# Patient Record
Sex: Female | Born: 1965
Health system: Southern US, Community
[De-identification: ages and names within clinical notes are randomized; demographics above are authoritative.]

## PROBLEM LIST (undated history)

## (undated) DIAGNOSIS — M1990 Unspecified osteoarthritis, unspecified site: Secondary | ICD-10-CM

## (undated) DIAGNOSIS — R0600 Dyspnea, unspecified: Secondary | ICD-10-CM

## (undated) DIAGNOSIS — I10 Essential (primary) hypertension: Secondary | ICD-10-CM

## (undated) DIAGNOSIS — F410 Panic disorder [episodic paroxysmal anxiety] without agoraphobia: Secondary | ICD-10-CM

## (undated) HISTORY — PX: APPENDECTOMY: SHX54

## (undated) HISTORY — DX: Unspecified osteoarthritis, unspecified site: M19.90

## (undated) HISTORY — DX: Dyspnea, unspecified: R06.00

## (undated) HISTORY — PX: TUBAL LIGATION: SHX77

## (undated) HISTORY — PX: JOINT REPLACEMENT: SHX530

## (undated) HISTORY — DX: Essential (primary) hypertension: I10

---

## 1998-05-12 ENCOUNTER — Inpatient Hospital Stay (HOSPITAL_COMMUNITY): Admission: AD | Admit: 1998-05-12 | Discharge: 1998-05-14 | Payer: Self-pay | Admitting: Obstetrics and Gynecology

## 2000-06-13 ENCOUNTER — Other Ambulatory Visit: Admission: RE | Admit: 2000-06-13 | Discharge: 2000-06-13 | Payer: Self-pay | Admitting: Obstetrics and Gynecology

## 2001-11-29 ENCOUNTER — Other Ambulatory Visit: Admission: RE | Admit: 2001-11-29 | Discharge: 2001-11-29 | Payer: Self-pay | Admitting: *Deleted

## 2003-01-30 ENCOUNTER — Other Ambulatory Visit: Admission: RE | Admit: 2003-01-30 | Discharge: 2003-01-30 | Payer: Self-pay | Admitting: Obstetrics and Gynecology

## 2004-03-08 ENCOUNTER — Other Ambulatory Visit: Admission: RE | Admit: 2004-03-08 | Discharge: 2004-03-08 | Payer: Self-pay | Admitting: Obstetrics and Gynecology

## 2005-05-30 ENCOUNTER — Other Ambulatory Visit: Admission: RE | Admit: 2005-05-30 | Discharge: 2005-05-30 | Payer: Self-pay | Admitting: Obstetrics and Gynecology

## 2005-12-08 ENCOUNTER — Inpatient Hospital Stay (HOSPITAL_COMMUNITY): Admission: AD | Admit: 2005-12-08 | Discharge: 2005-12-10 | Payer: Self-pay | Admitting: Obstetrics and Gynecology

## 2005-12-09 ENCOUNTER — Encounter (INDEPENDENT_AMBULATORY_CARE_PROVIDER_SITE_OTHER): Payer: Self-pay | Admitting: *Deleted

## 2008-01-16 ENCOUNTER — Ambulatory Visit (HOSPITAL_COMMUNITY): Admission: RE | Admit: 2008-01-16 | Discharge: 2008-01-16 | Payer: Self-pay | Admitting: Internal Medicine

## 2008-05-20 ENCOUNTER — Encounter (INDEPENDENT_AMBULATORY_CARE_PROVIDER_SITE_OTHER): Payer: Self-pay | Admitting: Obstetrics and Gynecology

## 2008-05-20 ENCOUNTER — Ambulatory Visit (HOSPITAL_COMMUNITY): Admission: RE | Admit: 2008-05-20 | Discharge: 2008-05-20 | Payer: Self-pay | Admitting: Obstetrics and Gynecology

## 2008-11-30 ENCOUNTER — Emergency Department (HOSPITAL_COMMUNITY): Admission: EM | Admit: 2008-11-30 | Discharge: 2008-12-01 | Payer: Self-pay | Admitting: Emergency Medicine

## 2009-01-28 ENCOUNTER — Ambulatory Visit (HOSPITAL_COMMUNITY): Admission: RE | Admit: 2009-01-28 | Discharge: 2009-01-28 | Payer: Self-pay | Admitting: Internal Medicine

## 2009-05-23 ENCOUNTER — Emergency Department (HOSPITAL_COMMUNITY): Admission: EM | Admit: 2009-05-23 | Discharge: 2009-05-23 | Payer: Self-pay | Admitting: Emergency Medicine

## 2010-02-12 ENCOUNTER — Ambulatory Visit (HOSPITAL_COMMUNITY): Admission: RE | Admit: 2010-02-12 | Discharge: 2010-02-12 | Payer: Self-pay | Admitting: Family Medicine

## 2010-03-19 ENCOUNTER — Ambulatory Visit: Payer: Self-pay | Admitting: Cardiology

## 2010-07-17 LAB — POCT CARDIAC MARKERS
Myoglobin, poc: 38.5 ng/mL (ref 12–200)
Troponin i, poc: <0.05 ng/mL (ref 0.00–0.09)

## 2010-07-17 LAB — BASIC METABOLIC PANEL
BUN: 8 mg/dL (ref 6–23)
Creatinine, Ser: 0.88 mg/dL (ref 0.4–1.2)
GFR calc Af Amer: >60 mL/min (ref >60)
GFR calc non Af Amer: >60 mL/min (ref >60)
Potassium: 3.4 mEq/L — ABNORMAL LOW (ref 3.5–5.1)

## 2010-07-17 LAB — DIFFERENTIAL
Lymphocytes Relative: 40 % (ref 12–46)
Lymphs Abs: 2 10*3/uL (ref 0.7–4.0)
Monocytes Relative: 7 % (ref 3–12)
Neutrophils Relative %: 51 % (ref 43–77)

## 2010-07-17 LAB — CBC
HCT: 34.7 % — ABNORMAL LOW (ref 36.0–46.0)
Platelets: 229 10*3/uL (ref 150–400)
RBC: 3.73 MIL/uL — ABNORMAL LOW (ref 3.87–5.11)
WBC: 4.9 10*3/uL (ref 4.0–10.5)

## 2010-07-27 LAB — CBC
Platelets: 235 10*3/uL (ref 150–400)
WBC: 3.9 10*3/uL — ABNORMAL LOW (ref 4.0–10.5)

## 2010-07-27 LAB — HCG, SERUM, QUALITATIVE: Preg, Serum: NEGATIVE

## 2010-08-24 NOTE — Op Note (Signed)
Evelyn Moyer, Evelyn Moyer                 ACCOUNT NO.:  [id number]   MEDICAL RECORD NO.:  [id number]          PATIENT TYPE:  AMB   LOCATION:  SDC                           FACILITY:  [facility]   PHYSICIAN:  Osborn Coho, M.D.   DATE OF BIRTH:  1965-06-28   DATE OF PROCEDURE:  05/20/2008  DATE OF DISCHARGE:                               OPERATIVE REPORT   PREOPERATIVE DIAGNOSES:  1. Menometrorrhagia.  2. Endometrial polyps.   POSTOPERATIVE DIAGNOSES:  1. Menometrorrhagia.  2. Endometrial polyps.   PROCEDURE:  1. Hysteroscopy.  2. D and C.  3. NovaSure ablation.   ATTENDING DOCTOR:  Osborn Coho, MD   ANESTHESIA:  General via LMA.   SPECIMENS TO PATHOLOGY:  Endometrial curettings.   FLUIDS:  1500 mL.   URINE OUTPUT:  Quantity sufficient approximately 300 mL of urine via  straight cath prior to procedure.   HYSTEROSCOPIC FLUID DEFICIT OF LR:  45 mL.   ESTIMATED BLOOD LOSS:  Minimal.   COMPLICATIONS:  None.   PROCEDURE:  The patient was taken to the operating room after risks,  benefits, and alternatives discussed with the patient.  The patient  verbalized understanding and consent signed and witnessed.  The patient  was placed under general anesthesia and prepped and draped in the normal  sterile fashion in the dorsal lithotomy position.  A bivalve speculum  was placed in the patient's vagina and the anterior lip of the cervix  grasped with single-tooth tenaculum.  The uterus sounded to 11 cm and  the cervical length measured 4.5 cm.  The cervix was sufficiently  dilated already for passage of the hysteroscope and hysteroscope was  introduced and multiple appearing polypoid lesions noted.  Curettage was  performed until a gritty texture was noted and specimen sent to  pathology.  The cervix was also sufficiently dilated already for passage  of the NovaSure instrument which was introduced into the uterine cavity  and the width measured 4.6 cm.  Ablation was performed after  cavity  assessment was passed which required the Vaseline gauze and an  additional tenaculum on the cervix.  The ablation was performed without  difficulty after cavity assessment passed and NovaSure instrument  removed.  The hysteroscope was then introduced and good ablation results  were noted.  The tenaculums were removed and there was some bleeding at  1 of the tenaculum sites which was made hemostatic with an interrupted  stitch of 3-0 chromic.  All instruments were removed.  Sponge, lap, and  needle count was correct.  The patient tolerated the procedure well and  is currently awaiting transfer to the recovery room in good condition.      Osborn Coho, M.D.  Electronically Signed     AR/MEDQ  D:  05/20/2008  T:  05/21/2008  Job:  [id number]

## 2010-08-27 NOTE — Op Note (Signed)
Evelyn Moyer, Evelyn Moyer                 ACCOUNT NO.:  [id number]   MEDICAL RECORD NO.:  [id number]          PATIENT TYPE:  INP   LOCATION:  9111                          FACILITY:  [facility]   PHYSICIAN:  Crist Fat. Rivard, M.D. DATE OF BIRTH:  1965-08-22   DATE OF PROCEDURE:  12/09/2005  DATE OF DISCHARGE:                                 OPERATIVE REPORT   PREOPERATIVE DIAGNOSIS:  Desire for sterilization.   POSTOPERATIVE DIAGNOSIS:  Desire for sterilization.   ANESTHESIA:  Epidural.   PROCEDURE:  Postpartum bilateral tubal ligation.   SURGEON:  Crist Fat. Rivard, M.D.   ESTIMATED BLOOD LOSS:  Minimal.   PROCEDURE:  After being informed of the planned procedure with possible  complications including irreversibility, failure rate of 1 in 1000, injury  to other organs, bleeding, infection, informed consent was obtained. The  patient is taken to OR3 and preplaced epidural catheter is used to ensure  epidural anesthesia.  The patient is then prepped and draped in a sterile  fashion.  After assessing adequate level of anesthesia, the umbilical area  is infiltrated with 7 mL of Marcaine 0.25 and we perform a semi-elliptical  incision which was bluntly brought down to the fascia.  The fascia is  identified, grasped with Kocher forceps, and incised.  The peritoneum is  identified, grasped with hemostat, and sectioned.  We then identified the  left tube which was grasped with two Babcock forceps, exteriorized until the  fimbriae is visualized, and we note at that time that there is significant  edema of the mesosalpinx, otherwise, the tube appears normal.  Mesosalpinx  was entered with cautery and we doubly ligate the proximal and the distal  stump with 0 chromic. A section of 1.5 cm of tube is then excised in the  isthmic ampullary area and each stump was cauterized.  Hemostasis was  completed. We proceed in the same fashion on the right side which also  showed some edema of the mesosalpinx.   Hemostasis was also adequate.   The fascia is closed with a running suture of 0 Vicryl and skin is then  closed with a subcuticular suture of 4-0 Monocryl and Steri-Strips.  Instrument and sponge count is complete x2.  Estimated blood loss is  minimal.  The procedure is well tolerated by the patient who is taken to  recovery room in a well and stable condition.      Crist Fat Rivard, M.D.  Electronically Signed     SAR/MEDQ  D:  12/09/2005  T:  12/09/2005  Job:  [id number]

## 2010-08-27 NOTE — H&P (Signed)
Evelyn Moyer, Evelyn Moyer                 ACCOUNT NO.:  [id number]   MEDICAL RECORD NO.:  [id number]          PATIENT TYPE:  MAT   LOCATION:  MATC                          [facility]ILITY:  [facility]   PHYSICIAN:  Hal Morales, M.D.DATE OF BIRTH:  07-03-1965   DATE OF ADMISSION:  12/08/2005  DATE OF DISCHARGE:                                HISTORY & PHYSICAL   This is a 45 year old G 3, P 2-0-0-2 at 86 and 3/7 weeks who presents with  early labor.  She denies leaking or bleeding.  Reports positive fetal  movement.  Pregnancy has been followed by the nurse-midwife service  remarkable for.  1. History of HSV.  2. AMA.  3. History of rapid labor.  4. History of oligo.  5. Fibroid.   ALLERGIES:  None.   OB HISTORY:  Remarkable for vaginal delivery in 1998 of a female infant at  [redacted] weeks gestation weighing 6 pounds, 2 ounces, remarkable for a 2 hour  labor.  She had a vaginal delivery in 2000 of a female infant at [redacted] weeks  gestation weighing 7 pounds.  Remarkable oligohydramnios and a 2 hour labor.   MEDICAL HISTORY:  Remarkable for a history of abnormal Pap in 1999 with  normal Pap since then, history of HSV diagnosed in 1994.   SURGICAL HISTORY:  Remarkable for appendectomy at age 43.   FAMILY HISTORY:  Remarkable for a daughter with heart murmur, mother with  hypertension, grandmother with aneurysm, son with asthma, grandmother with  diabetes and renal failure and stroke, mother with fibromyalgia, mother with  cervical cancer.   GENETIC HISTORY:  Remarkable for a daughter with heart murmur, father of the  baby with sickle cell trait, and father of the baby's aunt with sickle cell  disease, the father of the baby's father with MS.   SOCIAL HISTORY:  The patient is married to [employer] who is involved and  supportive.  She is of the [facility].  She is homemaker and student, and  her husband is an Pensions consultant.  She denies any alcohol, tobacco or drug use.   PRENATAL LABS:  Hemoglobin  12.2, platelets 279, blood type A positive,  antibody screen negative for sickle cell, negative RPR, nonreactive rubella  immune, hepatitis negative, HIV negative, cystic fibrosis declined.   HISTORY OF CURRENT PREGNANCY:  The patient enter care at 10-weeks gestation.  She had an ultrasound to confirm dates at that time, anterior fibroid 3-cm  was seen.  She declined amniocentesis, but did consent first trimester  screen which was normal.  She had an ultrasound at 20-weeks which was  normal.  Glucola was done at 28-weeks and was elevated at 138.  Three hour  GT was normal and she had group B strep positive at term.   PHYSICAL EXAMINATION:  VITAL SIGNS:  Stable, afebrile.  HEENT:  Within normal limits.  Thyroid normal, not enlarged.  CHEST:  Clear to auscultation.  HEART:  Regular rate and rhythm.  ABDOMEN:  Gravid at 37-cm, vertex Thayer Ohm shows reactive fetal heart rate  with contractions every 3-10 minutes.  Cervix was initially 2-cm and is now  4 cm, 90% effaced -1 with bulging membranes.  PELVIC EXAM:  Negative for HSV lesions.  EXTREMITIES:  Within normal limits.   ASSESSMENT:  1. Intrauterine pregnancy at 37 and 3/7 weeks.  2. Early labor.  3. Group B streptococcal positive.  4. History of rapid labor.   PLAN:  1. Admit to birthing suite, Dr. Pennie Rushing notified.  2. Routine CMN orders.  3. Penicillin prophylaxis.  4. AROM amniotomy after penicillin infused.      Marie L. Williams, C.N.M.      Hal Morales, M.D.  Electronically Signed    MLW/MEDQ  D:  12/08/2005  T:  12/08/2005  Job:  [id number]

## 2011-02-18 ENCOUNTER — Other Ambulatory Visit (HOSPITAL_COMMUNITY): Payer: Self-pay | Admitting: Obstetrics and Gynecology

## 2011-02-18 DIAGNOSIS — Z139 Encounter for screening, unspecified: Secondary | ICD-10-CM

## 2011-02-24 ENCOUNTER — Ambulatory Visit (HOSPITAL_COMMUNITY)
Admission: RE | Admit: 2011-02-24 | Discharge: 2011-02-24 | Disposition: A | Discharge status: Home / Self Care | Payer: BC Managed Care – PPO | Source: Ambulatory Visit | Attending: Obstetrics and Gynecology | Admitting: Obstetrics and Gynecology

## 2011-02-24 DIAGNOSIS — Z139 Encounter for screening, unspecified: Secondary | ICD-10-CM

## 2011-02-24 DIAGNOSIS — Z1231 Encounter for screening mammogram for malignant neoplasm of breast: Secondary | ICD-10-CM | POA: Insufficient documentation

## 2011-03-04 ENCOUNTER — Other Ambulatory Visit: Payer: Self-pay | Admitting: Obstetrics and Gynecology

## 2011-03-04 DIAGNOSIS — R928 Other abnormal and inconclusive findings on diagnostic imaging of breast: Secondary | ICD-10-CM

## 2011-03-30 ENCOUNTER — Ambulatory Visit (HOSPITAL_COMMUNITY)
Admission: RE | Admit: 2011-03-30 | Discharge: 2011-03-30 | Disposition: A | Discharge status: Home / Self Care | Payer: BC Managed Care – PPO | Source: Ambulatory Visit | Attending: Obstetrics and Gynecology | Admitting: Obstetrics and Gynecology

## 2011-03-30 ENCOUNTER — Other Ambulatory Visit (HOSPITAL_COMMUNITY): Payer: Self-pay | Admitting: Obstetrics and Gynecology

## 2011-03-30 DIAGNOSIS — R928 Other abnormal and inconclusive findings on diagnostic imaging of breast: Secondary | ICD-10-CM | POA: Insufficient documentation

## 2011-06-30 ENCOUNTER — Ambulatory Visit (INDEPENDENT_AMBULATORY_CARE_PROVIDER_SITE_OTHER): Payer: BC Managed Care – PPO | Admitting: Obstetrics and Gynecology

## 2011-06-30 DIAGNOSIS — Z01419 Encounter for gynecological examination (general) (routine) without abnormal findings: Secondary | ICD-10-CM

## 2011-06-30 DIAGNOSIS — Z13820 Encounter for screening for osteoporosis: Secondary | ICD-10-CM

## 2011-09-04 ENCOUNTER — Encounter: Payer: Self-pay | Admitting: *Deleted

## 2011-10-18 ENCOUNTER — Other Ambulatory Visit (HOSPITAL_COMMUNITY): Payer: Self-pay | Admitting: Family Medicine

## 2011-10-18 DIAGNOSIS — IMO0001 Reserved for inherently not codable concepts without codable children: Secondary | ICD-10-CM

## 2011-10-18 DIAGNOSIS — Z139 Encounter for screening, unspecified: Secondary | ICD-10-CM

## 2011-10-26 ENCOUNTER — Ambulatory Visit (HOSPITAL_COMMUNITY)
Admission: RE | Admit: 2011-10-26 | Discharge: 2011-10-26 | Disposition: A | Discharge status: Home / Self Care | Payer: BC Managed Care – PPO | Source: Ambulatory Visit | Attending: Family Medicine | Admitting: Family Medicine

## 2011-10-26 ENCOUNTER — Other Ambulatory Visit (HOSPITAL_COMMUNITY): Payer: Self-pay | Admitting: Family Medicine

## 2011-10-26 DIAGNOSIS — R928 Other abnormal and inconclusive findings on diagnostic imaging of breast: Secondary | ICD-10-CM | POA: Insufficient documentation

## 2011-10-26 DIAGNOSIS — IMO0001 Reserved for inherently not codable concepts without codable children: Secondary | ICD-10-CM

## 2011-10-26 DIAGNOSIS — Z09 Encounter for follow-up examination after completed treatment for conditions other than malignant neoplasm: Secondary | ICD-10-CM | POA: Insufficient documentation

## 2012-05-16 ENCOUNTER — Other Ambulatory Visit (HOSPITAL_COMMUNITY): Payer: Self-pay | Admitting: Family Medicine

## 2012-05-16 DIAGNOSIS — Z09 Encounter for follow-up examination after completed treatment for conditions other than malignant neoplasm: Secondary | ICD-10-CM

## 2012-05-23 ENCOUNTER — Ambulatory Visit (HOSPITAL_COMMUNITY)
Admission: RE | Admit: 2012-05-23 | Discharge: 2012-05-23 | Disposition: A | Discharge status: Home / Self Care | Payer: BC Managed Care – PPO | Source: Ambulatory Visit | Attending: Family Medicine | Admitting: Family Medicine

## 2012-05-23 DIAGNOSIS — N6489 Other specified disorders of breast: Secondary | ICD-10-CM | POA: Insufficient documentation

## 2012-05-23 DIAGNOSIS — Z09 Encounter for follow-up examination after completed treatment for conditions other than malignant neoplasm: Secondary | ICD-10-CM | POA: Insufficient documentation

## 2013-08-13 ENCOUNTER — Other Ambulatory Visit: Payer: Self-pay | Admitting: Obstetrics and Gynecology

## 2013-08-13 DIAGNOSIS — Z139 Encounter for screening, unspecified: Secondary | ICD-10-CM

## 2013-08-22 ENCOUNTER — Ambulatory Visit (HOSPITAL_COMMUNITY)
Admission: RE | Admit: 2013-08-22 | Discharge: 2013-08-22 | Disposition: A | Discharge status: Home / Self Care | Payer: BC Managed Care – PPO | Source: Ambulatory Visit | Attending: Obstetrics and Gynecology | Admitting: Obstetrics and Gynecology

## 2013-08-22 DIAGNOSIS — Z139 Encounter for screening, unspecified: Secondary | ICD-10-CM

## 2013-08-22 DIAGNOSIS — Z1231 Encounter for screening mammogram for malignant neoplasm of breast: Secondary | ICD-10-CM | POA: Insufficient documentation

## 2015-02-15 ENCOUNTER — Encounter (HOSPITAL_COMMUNITY): Payer: Self-pay | Admitting: *Deleted

## 2015-02-15 ENCOUNTER — Emergency Department (HOSPITAL_COMMUNITY)
Admission: EM | Admit: 2015-02-15 | Discharge: 2015-02-15 | Disposition: A | Discharge status: Home / Self Care | Payer: BLUE CROSS/BLUE SHIELD | Attending: Emergency Medicine | Admitting: Emergency Medicine

## 2015-02-15 DIAGNOSIS — Z79899 Other long term (current) drug therapy: Secondary | ICD-10-CM | POA: Insufficient documentation

## 2015-02-15 DIAGNOSIS — R6883 Chills (without fever): Secondary | ICD-10-CM | POA: Diagnosis present

## 2015-02-15 DIAGNOSIS — M549 Dorsalgia, unspecified: Secondary | ICD-10-CM | POA: Diagnosis not present

## 2015-02-15 DIAGNOSIS — J029 Acute pharyngitis, unspecified: Secondary | ICD-10-CM | POA: Diagnosis not present

## 2015-02-15 LAB — URINALYSIS, ROUTINE W REFLEX MICROSCOPIC
BILIRUBIN URINE: NEGATIVE
Glucose, UA: NEGATIVE mg/dL
Hgb urine dipstick: NEGATIVE
KETONES UR: NEGATIVE mg/dL
LEUKOCYTES UA: NEGATIVE
NITRITE: NEGATIVE
PROTEIN: NEGATIVE mg/dL
Specific Gravity, Urine: 1.023 (ref 1.005–1.030)
UROBILINOGEN UA: 0.2 mg/dL (ref 0.0–1.0)
pH: 6.5 (ref 5.0–8.0)

## 2015-02-15 LAB — RAPID STREP SCREEN (MED CTR MEBANE ONLY): STREPTOCOCCUS, GROUP A SCREEN (DIRECT): NEGATIVE

## 2015-02-15 MED ORDER — IBUPROFEN 800 MG PO TABS: 800.0000 mg | ORAL_TABLET | Freq: Three times a day (TID) | ORAL | Status: DC | PRN

## 2015-02-15 MED ORDER — KETOROLAC TROMETHAMINE 60 MG/2ML IM SOLN
60.0000 mg | Freq: Once | INTRAMUSCULAR | Status: AC
  Administered 2015-02-15: 60 mg via INTRAMUSCULAR
  Filled 2015-02-15: qty 2

## 2015-02-15 NOTE — ED Provider Notes (Signed)
CSN: [id number]     Arrival date & time 02/15/15  [id number] History   First MD Initiated Contact with Patient 02/15/15 0900     Chief Complaint  Patient presents with  . Generalized Body Aches     (Consider location/radiation/quality/duration/timing/severity/associated sxs/prior Treatment) The history is provided by the patient.     Pt p/w 4 days of sore throat, sharp left mid-back pain, chills, myalgias.  Has chronic sinus congestion as well.  Has taken mucinex and motrin without improvement.  Last dose motrin last night- has been taking only 200mg  once daily. Denies cough, SOB, abdominal pain, urinary, vaginal, or bowel symptoms.  Does not have periods - had ablation in 2007.    Past Medical History  Diagnosis Date  . Dyspnea    Past Surgical History  Procedure Laterality Date  . Appendectomy    . Tubal ligation     Family History  Problem Relation Age of Onset  . Hypertension Mother   . Diabetes Mother    Social History  Substance Use Topics  . Smoking status: Never Smoker   . Smokeless tobacco: None  . Alcohol Use: 0.6 oz/week    1 Glasses of wine per week   OB History    No data available     Review of Systems  All other systems reviewed and are negative.     Allergies  Review of patient's allergies indicates no known allergies.  Home Medications   Prior to Admission medications   Medication Sig Start Date End Date Taking? Authorizing Provider  cholecalciferol (VITAMIN D) 1000 UNITS tablet Take 1,000 Units by mouth daily.   Yes Historical Provider, MD  valACYclovir (VALTREX) 500 MG tablet Take 500 mg by mouth daily as needed.   Yes Historical Provider, MD   BP 123/54 mmHg  Pulse 89  Temp(Src) 99.9 F (37.7 C) (Oral)  Resp 18  Ht 5\' 1"  (1.549 m)  Wt 110 lb (49.896 kg)  BMI 20.80 kg/m2  SpO2 100% Physical Exam  Constitutional: She appears well-developed and well-nourished. No distress.  HENT:  Head: Normocephalic and atraumatic.  Mouth/Throat: Mucous  membranes are not dry. Posterior oropharyngeal erythema present. No oropharyngeal exudate, posterior oropharyngeal edema or tonsillar abscesses.  Neck: Normal range of motion. Neck supple.  Cardiovascular: Normal rate and regular rhythm.   Pulmonary/Chest: Effort normal and breath sounds normal. No respiratory distress. She has no wheezes. She has no rales.  Abdominal: Soft. She exhibits no distension. There is no tenderness. There is no rebound and no guarding.  Lymphadenopathy:    She has no cervical adenopathy.  Neurological: She is alert.  Skin: She is not diaphoretic.  Psychiatric: She has a normal mood and affect. Her behavior is normal.  Nursing note and vitals reviewed.   ED Course  Procedures (including critical care time) Labs Review Labs Reviewed  RAPID ST[facility] SCREEN (NOT [facility])  CULTURE, GROUP A STREP  URINALYSIS, ROUTINE W REFLEX MICROSCOPIC (NOT AT [facility])    Imaging Review No results found. I have personally reviewed and evaluated these images and lab results as part of my medical decision-making.   EKG Interpretation None       11:14 AM Pt reports she is feeling much better after toradol.    MDM   Final diagnoses:  Pharyngitis    Pt with 4 days of sore throat, mylagias, chills, back pain with negative strep screen.  Had left-sided back pain and very vague left flank tenderness but denies any urinary, vaginal,  or bowel symptoms.  The back pain is worse with movement, this may be muscular soreness or myalgia related to viral illness.  Discussed this at length with patient and family member and advised watchful waiting, if any further symptoms develop to return for recheck.  UA negative. Pt does have source for fever with significant erythema of pharynx with all symptoms beginning at the same time. Likely viral pharyngitis.  Strep culture pending.  Abdominal exam is nonsurgical.  No airway concerns, no peritonsillar abscess.  Pt feeling better after IM toradol.   Will treat symptomatically, PCP follow up PRN.  D/C home with motrin .  Discussed result, findings, treatment, and follow up  with patient.  Pt given return precautions.  Pt verbalizes understanding and agrees with plan.       Trixie Dredge, PA-C 02/15/15 1322  Eber Hong, MD 02/15/15 2044

## 2015-02-15 NOTE — Discharge Instructions (Signed)
Read the information below.  Use the prescribed medication as directed.  Please discuss all new medications with your pharmacist.  You may return to the Emergency Department at any time for worsening condition or any new symptoms that concern you.  If you develop high fevers, difficulty swallowing or breathing, or you are unable to tolerate fluids by mouth, return to the ER immediately for a recheck.      Pharyngitis Pharyngitis is redness, pain, and swelling (inflammation) of your pharynx.  CAUSES  Pharyngitis is usually caused by infection. Most of the time, these infections are from viruses (viral) and are part of a cold. However, sometimes pharyngitis is caused by bacteria (bacterial). Pharyngitis can also be caused by allergies. Viral pharyngitis may be spread from person to person by coughing, sneezing, and personal items or utensils (cups, forks, spoons, toothbrushes). Bacterial pharyngitis may be spread from person to person by more intimate contact, such as kissing.  SIGNS AND SYMPTOMS  Symptoms of pharyngitis include:   Sore throat.   Tiredness (fatigue).   Low-grade fever.   Headache.  Joint pain and muscle aches.  Skin rashes.  Swollen lymph nodes.  Plaque-like film on throat or tonsils (often seen with bacterial pharyngitis). DIAGNOSIS  Your health care provider will ask you questions about your illness and your symptoms. Your medical history, along with a physical exam, is often all that is needed to diagnose pharyngitis. Sometimes, a rapid strep test is done. Other lab tests may also be done, depending on the suspected cause.  TREATMENT  Viral pharyngitis will usually get better in 3-4 days without the use of medicine. Bacterial pharyngitis is treated with medicines that kill germs (antibiotics).  HOME CARE INSTRUCTIONS   Drink enough water and fluids to keep your urine clear or pale yellow.   Only take over-the-counter or prescription medicines as directed by  your health care provider:   If you are prescribed antibiotics, make sure you finish them even if you start to feel better.   Do not take aspirin.   Get lots of rest.   Gargle with 8 oz of salt water ( tsp of salt per 1 qt of water) as often as every 1-2 hours to soothe your throat.   Throat lozenges (if you are not at risk for choking) or sprays may be used to soothe your throat. SEEK MEDICAL CARE IF:   You have large, tender lumps in your neck.  You have a rash.  You cough up green, yellow-brown, or bloody spit. SEEK IMMEDIATE MEDICAL CARE IF:   Your neck becomes stiff.  You drool or are unable to swallow liquids.  You vomit or are unable to keep medicines or liquids down.  You have severe pain that does not go away with the use of recommended medicines.  You have trouble breathing (not caused by a stuffy nose). MAKE SURE YOU:   Understand these instructions.  Will watch your condition.  Will get help right away if you are not doing well or get worse.   This information is not intended to replace advice given to you by your health care provider. Make sure you discuss any questions you have with your health care provider.   Document Released: 03/28/2005 Document Revised: 01/16/2013 Document Reviewed: 12/03/2012 Elsevier Interactive Patient Education 2016 [employer]  Rapid Strep Test Strep throat is a bacterial infection caused by the bacteria Streptococcus pyogenes. A rapid strep test is the quickest way to check if these bacteria are causing your  sore throat. The test can be done at your health care provider's office. Results are usually ready in 10-20 minutes. You may have this test if you have symptoms of strep throat. These include:   A red throat with yellow or white spots.  Neck swelling and tenderness.  Fever.  Loss of appetite.  Trouble breathing or swallowing.  Rash.  Dehydration. This test requires a sample of fluid from the back of your  throat and tonsils. Your health care provider may hold down your tongue with a tongue depressor and use a swab to collect the sample.  Your health care provider may collect a second sample at the same time. The second sample may be used for a throat culture. In a culture test, the sample is combined with a substance that encourages bacteria to grow. It takes longer to get the results of the throat culture test, but they are more accurate. They can confirm the results from a rapid strep test, or show that those results were wrong. RESULTS  It is your responsibility to obtain your test results. Ask the lab or department performing the test when and how you will get your results. Contact your health care provider to discuss any questions you have about your results.  The results of the rapid strep test will be negative or positive.  Meaning of Negative Test Results If the result of your rapid strep test is negative, then it means:   It is likely that you do not have strep throat.  A virus may be causing your sore throat. Your health care provider may do a throat culture to confirm the results of the rapid strep test. The throat culture can also identify the different strains of strep bacteria. Meaning of Positive Test Results If the result of your rapid strep test is positive, then it means:  It is likely that you do have strep throat.  You may have to take antibiotics. Your health care provider may do a throat culture to confirm the results of the rapid strep test. Strep throat usually requires a course of antibiotics.    This information is not intended to replace advice given to you by your health care provider. Make sure you discuss any questions you have with your health care provider.   Document Released: 05/05/2004 Document Revised: 04/18/2014 Document Reviewed: 07/04/2013 Elsevier Interactive Patient Education [employer]

## 2015-02-15 NOTE — ED Notes (Signed)
Pt in from home c/o sore throat & mid L back pain onset x 4 days, pt reports generalized body aches onset today, pt denies cough, SOB, n/v/d, CP, pt A&O x4, afebrile

## 2015-02-18 LAB — CULTURE, GROUP A STREP: Strep A Culture: NEGATIVE

## 2015-09-08 DIAGNOSIS — Z124 Encounter for screening for malignant neoplasm of cervix: Secondary | ICD-10-CM | POA: Diagnosis not present

## 2015-09-08 DIAGNOSIS — Z6820 Body mass index (BMI) 20.0-20.9, adult: Secondary | ICD-10-CM | POA: Diagnosis not present

## 2015-09-08 DIAGNOSIS — Z01419 Encounter for gynecological examination (general) (routine) without abnormal findings: Secondary | ICD-10-CM | POA: Diagnosis not present

## 2015-09-08 DIAGNOSIS — Z1231 Encounter for screening mammogram for malignant neoplasm of breast: Secondary | ICD-10-CM | POA: Diagnosis not present

## 2015-11-10 DIAGNOSIS — Z13220 Encounter for screening for lipoid disorders: Secondary | ICD-10-CM | POA: Diagnosis not present

## 2015-11-10 DIAGNOSIS — E559 Vitamin D deficiency, unspecified: Secondary | ICD-10-CM | POA: Diagnosis not present

## 2015-11-10 DIAGNOSIS — Z0000 Encounter for general adult medical examination without abnormal findings: Secondary | ICD-10-CM | POA: Diagnosis not present

## 2016-02-17 DIAGNOSIS — R197 Diarrhea, unspecified: Secondary | ICD-10-CM | POA: Diagnosis not present

## 2016-03-17 DIAGNOSIS — R109 Unspecified abdominal pain: Secondary | ICD-10-CM | POA: Diagnosis not present

## 2016-03-17 DIAGNOSIS — R102 Pelvic and perineal pain: Secondary | ICD-10-CM | POA: Diagnosis not present

## 2016-03-17 DIAGNOSIS — N925 Other specified irregular menstruation: Secondary | ICD-10-CM | POA: Diagnosis not present

## 2016-03-24 DIAGNOSIS — R102 Pelvic and perineal pain: Secondary | ICD-10-CM | POA: Diagnosis not present

## 2016-03-24 DIAGNOSIS — N83209 Unspecified ovarian cyst, unspecified side: Secondary | ICD-10-CM | POA: Diagnosis not present

## 2016-09-09 DIAGNOSIS — Z01419 Encounter for gynecological examination (general) (routine) without abnormal findings: Secondary | ICD-10-CM | POA: Diagnosis not present

## 2016-09-09 DIAGNOSIS — Z6820 Body mass index (BMI) 20.0-20.9, adult: Secondary | ICD-10-CM | POA: Diagnosis not present

## 2016-09-09 DIAGNOSIS — Z124 Encounter for screening for malignant neoplasm of cervix: Secondary | ICD-10-CM | POA: Diagnosis not present

## 2016-09-09 DIAGNOSIS — Z1231 Encounter for screening mammogram for malignant neoplasm of breast: Secondary | ICD-10-CM | POA: Diagnosis not present

## 2016-09-29 DIAGNOSIS — N83201 Unspecified ovarian cyst, right side: Secondary | ICD-10-CM | POA: Diagnosis not present

## 2016-09-29 DIAGNOSIS — Z09 Encounter for follow-up examination after completed treatment for conditions other than malignant neoplasm: Secondary | ICD-10-CM | POA: Diagnosis not present

## 2016-09-29 DIAGNOSIS — N800 Endometriosis of uterus: Secondary | ICD-10-CM | POA: Diagnosis not present

## 2016-12-27 DIAGNOSIS — N898 Other specified noninflammatory disorders of vagina: Secondary | ICD-10-CM | POA: Diagnosis not present

## 2017-06-30 DIAGNOSIS — J069 Acute upper respiratory infection, unspecified: Secondary | ICD-10-CM | POA: Diagnosis not present

## 2017-06-30 DIAGNOSIS — R42 Dizziness and giddiness: Secondary | ICD-10-CM | POA: Diagnosis not present

## 2017-06-30 DIAGNOSIS — D649 Anemia, unspecified: Secondary | ICD-10-CM | POA: Diagnosis not present

## 2017-09-14 DIAGNOSIS — Z01419 Encounter for gynecological examination (general) (routine) without abnormal findings: Secondary | ICD-10-CM | POA: Diagnosis not present

## 2017-09-14 DIAGNOSIS — R208 Other disturbances of skin sensation: Secondary | ICD-10-CM | POA: Diagnosis not present

## 2017-09-14 DIAGNOSIS — Z124 Encounter for screening for malignant neoplasm of cervix: Secondary | ICD-10-CM | POA: Diagnosis not present

## 2017-09-14 DIAGNOSIS — Z1231 Encounter for screening mammogram for malignant neoplasm of breast: Secondary | ICD-10-CM | POA: Diagnosis not present

## 2017-09-14 DIAGNOSIS — Z6820 Body mass index (BMI) 20.0-20.9, adult: Secondary | ICD-10-CM | POA: Diagnosis not present

## 2017-09-14 DIAGNOSIS — N898 Other specified noninflammatory disorders of vagina: Secondary | ICD-10-CM | POA: Diagnosis not present

## 2017-09-14 DIAGNOSIS — N951 Menopausal and female climacteric states: Secondary | ICD-10-CM | POA: Diagnosis not present

## 2017-09-27 DIAGNOSIS — Z13220 Encounter for screening for lipoid disorders: Secondary | ICD-10-CM | POA: Diagnosis not present

## 2017-09-27 DIAGNOSIS — Z8249 Family history of ischemic heart disease and other diseases of the circulatory system: Secondary | ICD-10-CM | POA: Diagnosis not present

## 2017-09-27 DIAGNOSIS — Z0000 Encounter for general adult medical examination without abnormal findings: Secondary | ICD-10-CM | POA: Diagnosis not present

## 2017-09-27 DIAGNOSIS — E559 Vitamin D deficiency, unspecified: Secondary | ICD-10-CM | POA: Diagnosis not present

## 2018-01-25 ENCOUNTER — Ambulatory Visit (INDEPENDENT_AMBULATORY_CARE_PROVIDER_SITE_OTHER): Payer: Self-pay

## 2018-01-25 DIAGNOSIS — Z1211 Encounter for screening for malignant neoplasm of colon: Secondary | ICD-10-CM

## 2018-01-25 MED ORDER — NA SULFATE-K SULFATE-MG SULF 17.5-3.13-1.6 GM/177ML PO SOLN: 1.0000 | ORAL | 0 refills | Status: DC

## 2018-01-25 NOTE — Patient Instructions (Signed)
Evelyn Moyer  December 02, 1965 MRN: [id number]     Procedure Date: 03/30/18 Time to register: 11:00am Place to register: [facility] Procedure Time: 12:00pm Scheduled provider: R. Garfield Cornea, MD    PREPARATION FOR COLONOSCOPY WITH SUPREP BOWEL PREP KIT  Note: Suprep Bowel Prep Kit is a split-dose (2day) regimen. Consumption of BOTH 6-ounce bottles is required for a complete prep.  Please notify us immediately if you are diabetic, take iron supplements, or if you are on Coumadin or any other blood thinners.                                                                                                                                                  2 DAYS BEFORE PROCEDURE:  DATE: 03/28/18  DAY: Wednesday Begin clear liquid diet AFTER your lunch meal. NO SOLID FOODS after this point.  1 DAY BEFORE PROCEDURE:  DATE: 03/29/18   DAY: Thursday Continue clear liquids the entire day - NO SOLID FOOD.   At 6:00pm: Complete steps 1 through 4 below, using ONE (1) 6-ounce bottle, before going to bed. Step 1:  Pour ONE (1) 6-ounce bottle of SUPREP liquid into the mixing container.  Step 2:  Add cool drinking water to the 16 ounce line on the container and mix.  Note: Dilute the solution concentrate as directed prior to use. Step 3:  DRINK ALL the liquid in the container. Step 4:  You MUST drink an additional two (2) or more 16 ounce containers of water over the next one (1) hour.   Continue clear liquids.  DAY OF PROCEDURE:   DATE: 03/30/18   DAY: Friday If you take medications for your heart, blood pressure, or breathing, you may take these medications.   5 hours before your procedure at 7:00am Step 1:  Pour ONE (1) 6-ounce bottle of SUPREP liquid into the mixing container.  Step 2:  Add cool drinking water to the 16 ounce line on the container and mix.  Note: Dilute the solution concentrate as directed prior to use. Step 3:  DRINK ALL the liquid in the container. Step 4:  You  MUST drink an additional two (2) or more 16 ounce containers of water over the next one (1) hour. You MUST complete the final glass of water at least 3 hours before your colonoscopy   Nothing by mouth past 9:00am  You may take your morning medications with sip of water unless we have instructed otherwise.    Please see below for Dietary Information.  CLEAR LIQUIDS INCLUDE:  Water Jello (NOT red in color)   Ice Popsicles (NOT red in color)   Tea (sugar ok, no milk/cream) Powdered fruit flavored drinks  Coffee (sugar ok, no milk/cream) Gatorade/ Lemonade/ Kool-Aid  (NOT red in color)   Juice: apple, white grape, white cranberry Soft drinks  Clear  bullion, consomme, broth (fat free beef/chicken/vegetable)  Carbonated beverages (any kind)  Strained chicken noodle soup Hard Candy   Remember: Clear liquids are liquids that will allow you to see your fingers on the other side of a clear glass. Be sure liquids are NOT red in color, and not cloudy, but CLEAR.  DO NOT EAT OR DRINK ANY OF THE FOLLOWING:  Dairy products of any kind   Cranberry juice Tomato juice / V8 juice   Grapefruit juice Orange juice     Red grape juice  Do not eat any solid foods, including such foods as: cereal, oatmeal, yogurt, fruits, vegetables, creamed soups, eggs, bread, crackers, pureed foods in a blender, etc.   HELPFUL HINTS FOR DRINKING PREP SOLUTION:   Make sure prep is extremely cold. Mix and refrigerate the the morning of the prep. You may also put in the freezer.   You may try mixing some Crystal Light or Country Time Lemonade if you prefer. Mix in small amounts; add more if necessary.  Try drinking through a straw  Rinse mouth with water or a mouthwash between glasses, to remove after-taste.  Try sipping on a cold beverage /ice/ popsicles between glasses of prep.  Place a piece of sugar-free hard candy in mouth between glasses.  If you become nauseated, try consuming smaller amounts, or stretch out  the time between glasses. Stop for 30-60 minutes, then slowly start back drinking.     OTHER INSTRUCTIONS  You will need a responsible adult at least 50 years of age to accompany you and drive you home. This person must remain in the waiting room during your procedure. The hospital will cancel your procedure if you do not have a responsible adult with you.   1. Wear loose fitting clothing that is easily removed. 2. Leave jewelry and other valuables at home.  3. Remove all body piercing jewelry and leave at home. 4. Total time from sign-in until discharge is approximately 2-3 hours. 5. You should go home directly after your procedure and rest. You can resume normal activities the day after your procedure. 6. The day of your procedure you should not:  Drive  Make legal decisions  Operate machinery  Drink alcohol  Return to work   You may call the office (Dept: [phone]) before 5:00pm, or page the doctor on call [phone]) after 5:00pm, for further instructions, if necessary.   Insurance Information YOU WILL NEED TO CHECK WITH YOUR INSURANCE COMPANY FOR THE BENEFITS OF COVERAGE YOU HAVE FOR THIS PROCEDURE.  UNFORTUNATELY, NOT ALL INSURANCE COMPANIES HAVE BENEFITS TO COVER ALL OR PART OF THESE TYPES OF PROCEDURES.  IT IS YOUR RESPONSIBILITY TO CHECK YOUR BENEFITS, HOWEVER, WE WILL BE GLAD TO ASSIST YOU WITH ANY CODES YOUR INSURANCE COMPANY MAY NEED.    PLEASE NOTE THAT MOST INSURANCE COMPANIES WILL NOT COVER A SCREENING COLONOSCOPY FOR PEOPLE UNDER THE AGE OF 50  IF YOU HAVE BCBS INSURANCE, YOU MAY HAVE BENEFITS FOR A SCREENING COLONOSCOPY BUT IF POLYPS ARE FOUND THE DIAGNOSIS WILL CHANGE AND THEN YOU MAY HAVE A DEDUCTIBLE THAT WILL NEED TO BE MET. SO PLEASE MAKE SURE YOU CHECK YOUR BENEFITS FOR A SCREENING COLONOSCOPY AS WELL AS A DIAGNOSTIC COLONOSCOPY.

## 2018-01-25 NOTE — Progress Notes (Signed)
Gastroenterology Pre-Procedure Review  Request Date:01/25/18 Requesting Physician: Horton Marshall PA [facility]- no previous tcs.   PATIENT REVIEW QUESTIONS: The patient responded to the following health history questions as indicated:    1. Diabetes Melitis: no 2. Joint replacements in the past 12 months: no 3. Major health problems in the past 3 months: no 4. Has an artificial valve or MVP: no 5. Has a defibrillator: no 6. Has been advised in past to take antibiotics in advance of a procedure like teeth cleaning: no 7. Family history of colon cancer: yes (uncle)  8. Alcohol Use: yes (occasionally wine) 9. History of sleep apnea: no  10. History of coronary artery or other vascular stents placed within the last 12 months: no 11. History of any prior anesthesia complications: no    MEDICATIONS & ALLERGIES:    Patient reports the following regarding taking any blood thinners:   Plavix? no Aspirin? no Coumadin? no Brilinta? no Xarelto? no Eliquis? no Pradaxa? no Savaysa? no Effient? no  Patient confirms/reports the following medications:  Current Outpatient Medications  Medication Sig Dispense Refill  . guaiFENesin (MUCINEX) 600 MG 12 hr tablet Take 600 mg by mouth daily as needed for cough or to loosen phlegm.    Marland Kitchen ibuprofen (ADVIL,MOTRIN) 800 MG tablet Take 1 tablet (800 mg total) by mouth every 8 (eight) hours as needed for mild pain or moderate pain. 15 tablet 0  . Probiotic Product (PROBIOTIC DAILY PO) Take by mouth.    . valACYclovir (VALTREX) 500 MG tablet Take 500 mg by mouth daily as needed.    . cholecalciferol (VITAMIN D) 1000 UNITS tablet Take 1,000 Units by mouth daily.     No current facility-administered medications for this visit.     Patient confirms/reports the following allergies:  No Known Allergies  No orders of the defined types were placed in this encounter.   AUTHORIZATION INFORMATION Primary Insurance: [place],  [place] #: [id number] Pre-Cert /  Berkley Harvey required: no  SCHEDULE INFORMATION: Procedure has been scheduled as follows:  Date: 03/30/18, Time: 12:00  Location: [facility]  This Gastroenterology Pre-Precedure Review Form is being routed to the following provider(s): Tana Coast, PA

## 2018-02-01 NOTE — Progress Notes (Signed)
Ok to schedule.

## 2018-03-30 ENCOUNTER — Ambulatory Visit (HOSPITAL_COMMUNITY)
Admission: RE | Admit: 2018-03-30 | Discharge: 2018-03-30 | Disposition: A | Discharge status: Home / Self Care | Payer: BLUE CROSS/BLUE SHIELD | Attending: Internal Medicine | Admitting: Internal Medicine

## 2018-03-30 ENCOUNTER — Encounter (HOSPITAL_COMMUNITY): Payer: Self-pay | Admitting: *Deleted

## 2018-03-30 ENCOUNTER — Encounter (HOSPITAL_COMMUNITY)
Admission: RE | Disposition: A | Discharge status: Home / Self Care | Payer: Self-pay | Source: Home / Self Care | Attending: Internal Medicine

## 2018-03-30 ENCOUNTER — Other Ambulatory Visit: Payer: Self-pay

## 2018-03-30 DIAGNOSIS — Z79899 Other long term (current) drug therapy: Secondary | ICD-10-CM | POA: Insufficient documentation

## 2018-03-30 DIAGNOSIS — Z8249 Family history of ischemic heart disease and other diseases of the circulatory system: Secondary | ICD-10-CM | POA: Diagnosis not present

## 2018-03-30 DIAGNOSIS — Z1211 Encounter for screening for malignant neoplasm of colon: Secondary | ICD-10-CM | POA: Diagnosis not present

## 2018-03-30 HISTORY — PX: COLONOSCOPY: SHX5424

## 2018-03-30 SURGERY — COLONOSCOPY
Anesthesia: Moderate Sedation

## 2018-03-30 MED ORDER — ONDANSETRON HCL 4 MG/2ML IJ SOLN
INTRAMUSCULAR | Status: DC | PRN
  Administered 2018-03-30: 4 mg via INTRAVENOUS

## 2018-03-30 MED ORDER — MEPERIDINE HCL 50 MG/ML IJ SOLN
INTRAMUSCULAR | Status: AC
  Filled 2018-03-30: qty 1

## 2018-03-30 MED ORDER — SODIUM CHLORIDE 0.9 % IV SOLN
INTRAVENOUS | Status: DC
  Administered 2018-03-30: 1000 mL via INTRAVENOUS

## 2018-03-30 MED ORDER — STERILE WATER FOR IRRIGATION IR SOLN
Status: DC | PRN
  Administered 2018-03-30: 11:00:00

## 2018-03-30 MED ORDER — ONDANSETRON HCL 4 MG/2ML IJ SOLN
INTRAMUSCULAR | Status: AC
  Filled 2018-03-30: qty 2

## 2018-03-30 MED ORDER — MIDAZOLAM HCL 5 MG/5ML IJ SOLN
INTRAMUSCULAR | Status: AC
  Filled 2018-03-30: qty 10

## 2018-03-30 MED ORDER — MEPERIDINE HCL 100 MG/ML IJ SOLN
INTRAMUSCULAR | Status: DC | PRN
  Administered 2018-03-30: 25 mg via INTRAVENOUS

## 2018-03-30 MED ORDER — MIDAZOLAM HCL 5 MG/5ML IJ SOLN
INTRAMUSCULAR | Status: DC | PRN
  Administered 2018-03-30: 2 mg via INTRAVENOUS
  Administered 2018-03-30: 1 mg via INTRAVENOUS

## 2018-03-30 NOTE — H&P (Signed)
$'@LOGO'J$ @   Primary Care Physician:  Lois Huxley, PA Primary Gastroenterologist:  Dr. Gala Romney  Pre-Procedure History & Physical: HPI:  Evelyn Moyer is a 52 y.o. female is here for a screening colonoscopy.  No prior colonoscopy.  No GI symptoms.  No family history of any first-degree relatives with colon cancer  Past Medical History:  Diagnosis Date  . Dyspnea     Past Surgical History:  Procedure Laterality Date  . APPENDECTOMY    . TUBAL LIGATION      Prior to Admission medications   Medication Sig Start Date End Date Taking? Authorizing Provider  Probiotic Product (PROBIOTIC DAILY PO) Take 1 capsule by mouth daily.    Yes [provider]  valACYclovir (VALTREX) 500 MG tablet Take 500 mg by mouth daily as needed (outbreak).    Yes [provider]  VITAMIN D PO Take 5,000 Units by mouth every 7 (seven) days.    Yes [provider]  ibuprofen (ADVIL,MOTRIN) 800 MG tablet Take 1 tablet (800 mg total) by mouth every 8 (eight) hours as needed for mild pain or moderate pain. Patient not taking: Reported on 03/23/2018 02/15/15   Clayton Bibles, PA-C  Na Sulfate-K Sulfate-Mg Sulf (SUPREP BOWEL PREP KIT) 17.5-3.13-1.6 GM/177ML SOLN Take 1 kit by mouth as directed. 01/25/18   Mahala Menghini, PA-C    Allergies as of 01/25/2018  . (No Known Allergies)    Family History  Problem Relation Age of Onset  . Hypertension Mother   . Diabetes Mother     Social History   Socioeconomic History  . Marital status: Married    Spouse name: Not on file  . Number of children: 3  . Years of education: Not on file  . Highest education level: Not on file  Occupational History  . Not on file  Social Needs  . Financial resource strain: Not on file  . Food insecurity:    Worry: Not on file    Inability: Not on file  . Transportation needs:    Medical: Not on file    Non-medical: Not on file  Tobacco Use  . Smoking status: Never Smoker  . Smokeless tobacco: Never  Used  Substance and Sexual Activity  . Alcohol use: Yes    Alcohol/week: 1.0 standard drinks    Types: 1 Glasses of wine per week  . Drug use: Never  . Sexual activity: Not on file  Lifestyle  . Physical activity:    Days per week: Not on file    Minutes per session: Not on file  . Stress: Not on file  Relationships  . Social connections:    Talks on phone: Not on file    Gets together: Not on file    Attends religious service: Not on file    Active member of club or organization: Not on file    Attends meetings of clubs or organizations: Not on file    Relationship status: Not on file  . Intimate partner violence:    Fear of current or ex partner: Not on file    Emotionally abused: Not on file    Physically abused: Not on file    Forced sexual activity: Not on file  Other Topics Concern  . Not on file  Social History Narrative  . Not on file    Review of Systems: See HPI, otherwise negative ROS  Physical Exam: There were no vitals taken for this visit. General:   Alert,  Well-developed,  well-nourished, pleasant and cooperative in NAD Lungs:  Clear throughout to auscultation.   No wheezes, crackles, or rhonchi. No acute distress. Heart:  Regular rate and rhythm; no murmurs, clicks, rubs,  or gallops. Abdomen:  Soft, nontender and nondistended. No masses, hepatosplenomegaly or hernias noted. Normal bowel sounds, without guarding, and without rebound.    Impression/Plan: Evelyn Moyer is now here to undergo a screening colonoscopy.  First ever average risk screening examination.  Risks, benefits, limitations, imponderables and alternatives regarding colonoscopy have been reviewed with the patient. Questions have been answered. All parties agreeable.     Notice:  This dictation was prepared with Dragon dictation along with smaller phrase technology. Any transcriptional errors that result from this process are unintentional and may not be corrected upon review.

## 2018-03-30 NOTE — Op Note (Signed)
[facility] Patient Name: Evelyn Moyer Procedure Date: 03/30/2018 10:11 AM MRN: [id number] Date of Birth: 07/30/1965 Attending MD: Gennette Pac , MD CSN: [id number] Age: 52 Admit Type: Outpatient Procedure:                Colonoscopy Indications:              Screening for colorectal malignant neoplasm Providers:                Gennette Pac, MD, Judee Clara, RN, Dyann Ruddle Referring MD:              Medicines:                Midazolam 4 mg IV, Meperidine 25 mg IV, Ondansetron                            4 mg IV Complications:            No immediate complications. Estimated Blood Loss:     Estimated blood loss: none. Estimated blood loss:                            none. Procedure:                Pre-Anesthesia Assessment:                           - Prior to the procedure, a History and Physical                            was performed, and patient medications and                            allergies were reviewed. The patient's tolerance of                            previous anesthesia was also reviewed. The risks                            and benefits of the procedure and the sedation                            options and risks were discussed with the patient.                            All questions were answered, and informed consent                            was obtained. Prior Anticoagulants: The patient has                            taken no previous anticoagulant or antiplatelet                            agents.  ASA Grade Assessment: II - A patient with                            mild systemic disease. After reviewing the risks                            and benefits, the patient was deemed in                            satisfactory condition to undergo the procedure.                           After obtaining informed consent, the colonoscope                            was passed under direct vision. Throughout the                             procedure, the patient's blood pressure, pulse, and                            oxygen saturations were monitored continuously. The                            [id number]-[id number] ([id number] scope was introduced through                            the anus and advanced to the the cecum, identified                            by appendiceal orifice and ileocecal valve. Scope In: 10:57:45 AM Scope Out: 11:07:51 AM Scope Withdrawal Time: 0 hours 6 minutes 27 seconds  Total Procedure Duration: 0 hours 10 minutes 6 seconds  Findings:      The perianal and digital rectal examinations were normal.      The exam was otherwise without abnormality on direct and retroflexion       views. Impression:               - The examination was normal on direct and                            retroflexion views.                           - No specimens collected. Moderate Sedation:      Moderate (conscious) sedation was administered by the endoscopy nurse       and supervised by the endoscopist. The following parameters were       monitored: oxygen saturation, heart rate, blood pressure, respiratory       rate, EKG, adequacy of pulmonary ventilation, and response to care.       Total physician intraservice time was 13 minutes. Recommendation:           - Patient has a contact number available for  emergencies. The signs and symptoms of potential                            delayed complications were discussed with the                            patient. Return to normal activities tomorrow.                            Written discharge instructions were provided to the                            patient.                           - Resume previous diet.                           - Continue present medications.                           - Repeat colonoscopy in 10 years for screening                            purposes.                           - Return to GI office  PRN. Procedure Code(s):        --- Professional ---                           [phone], Colonoscopy, flexible; diagnostic, including                            collection of specimen(s) by brushing or washing,                            when performed (separate procedure)                           G0500, Moderate sedation services provided by the                            same physician or other qualified health care                            professional performing a gastrointestinal                            endoscopic service that sedation supports,                            requiring the presence of an independent trained                            observer to assist in the monitoring of the  patient's level of consciousness and physiological                            status; initial 15 minutes of intra-service time;                            patient age 100 years or older (additional time may                            be reported with [id number], as appropriate) Diagnosis Code(s):        --- Professional ---                           Z12.11, Encounter for screening for malignant                            neoplasm of colon CPT copyright 2018 American Medical Association. All rights reserved. The codes documented in this report are preliminary and upon coder review may  be revised to meet current compliance requirements. Gerrit Friendsobert M. Jisselle Poth, MD Gennette Pacobert Michael Kathee Tumlin, MD 03/30/2018 11:16:04 AM This report has been signed electronically. Number of Addenda: 0

## 2018-03-30 NOTE — Discharge Instructions (Signed)

## 2018-04-06 ENCOUNTER — Encounter (HOSPITAL_COMMUNITY): Payer: Self-pay | Admitting: Internal Medicine

## 2018-12-19 DIAGNOSIS — Z124 Encounter for screening for malignant neoplasm of cervix: Secondary | ICD-10-CM | POA: Diagnosis not present

## 2018-12-19 DIAGNOSIS — Z6821 Body mass index (BMI) 21.0-21.9, adult: Secondary | ICD-10-CM | POA: Diagnosis not present

## 2018-12-19 DIAGNOSIS — Z1231 Encounter for screening mammogram for malignant neoplasm of breast: Secondary | ICD-10-CM | POA: Diagnosis not present

## 2018-12-19 DIAGNOSIS — Z01419 Encounter for gynecological examination (general) (routine) without abnormal findings: Secondary | ICD-10-CM | POA: Diagnosis not present

## 2019-03-13 DIAGNOSIS — Z13220 Encounter for screening for lipoid disorders: Secondary | ICD-10-CM | POA: Diagnosis not present

## 2019-03-13 DIAGNOSIS — Z0000 Encounter for general adult medical examination without abnormal findings: Secondary | ICD-10-CM | POA: Diagnosis not present

## 2019-04-09 DIAGNOSIS — R42 Dizziness and giddiness: Secondary | ICD-10-CM | POA: Diagnosis not present

## 2019-04-10 ENCOUNTER — Other Ambulatory Visit: Payer: Self-pay

## 2019-04-10 ENCOUNTER — Ambulatory Visit: Payer: BC Managed Care – PPO | Attending: Internal Medicine

## 2019-04-10 DIAGNOSIS — Z20822 Contact with and (suspected) exposure to covid-19: Secondary | ICD-10-CM

## 2019-04-12 LAB — NOVEL CORONAVIRUS, NAA: SARS-CoV-2, NAA: NOT DETECTED

## 2019-05-06 ENCOUNTER — Other Ambulatory Visit: Payer: Self-pay

## 2019-05-06 ENCOUNTER — Ambulatory Visit: Payer: BC Managed Care – PPO | Attending: Internal Medicine

## 2019-05-06 DIAGNOSIS — Z20822 Contact with and (suspected) exposure to covid-19: Secondary | ICD-10-CM | POA: Diagnosis not present

## 2019-05-07 LAB — NOVEL CORONAVIRUS, NAA: SARS-CoV-2, NAA: NOT DETECTED

## 2019-06-16 DIAGNOSIS — Z23 Encounter for immunization: Secondary | ICD-10-CM | POA: Diagnosis not present

## 2019-07-07 DIAGNOSIS — Z23 Encounter for immunization: Secondary | ICD-10-CM | POA: Diagnosis not present

## 2019-11-17 ENCOUNTER — Other Ambulatory Visit: Payer: Self-pay

## 2019-11-17 ENCOUNTER — Ambulatory Visit
Admission: EM | Admit: 2019-11-17 | Discharge: 2019-11-17 | Disposition: A | Discharge status: Home / Self Care | Payer: BC Managed Care – PPO | Attending: Emergency Medicine | Admitting: Emergency Medicine

## 2019-11-17 DIAGNOSIS — Z20822 Contact with and (suspected) exposure to covid-19: Secondary | ICD-10-CM

## 2019-11-17 NOTE — ED Triage Notes (Signed)
covid test for travel

## 2019-11-17 NOTE — ED Provider Notes (Signed)
[place]   [id number] 11/17/19 Arrival Time: 4765   CC: COVID test  SUBJECTIVE: History from: patient.  Evelyn Moyer is a 54 y.o. female who presents for COVID testing.  Denies sick exposure to COVID, flu or strep.  Needs test for travel.  Denies aggravating or alleviating symptoms.  Denies previous COVID infection.   Denies fever, chills, fatigue, nasal congestion, rhinorrhea, sore throat, cough, SOB, wheezing, chest pain, nausea, vomiting, changes in bowel or bladder habits.     Had COVID vaccines  ROS: As per HPI.  All other pertinent ROS negative.     Past Medical History:  Diagnosis Date  . Dyspnea    Past Surgical History:  Procedure Laterality Date  . APPENDECTOMY    . COLONOSCOPY N/A 03/30/2018   Procedure: COLONOSCOPY;  Surgeon: Daneil Dolin, MD;  Location: [facility];  Service: Endoscopy;  Laterality: N/A;  12:00  . TUBAL LIGATION     No Known Allergies No current facility-administered medications on file prior to encounter.   Current Outpatient Medications on File Prior to Encounter  Medication Sig Dispense Refill  . Na Sulfate-K Sulfate-Mg Sulf (SUPREP BOWEL PREP KIT) 17.5-3.13-1.6 GM/177ML SOLN Take 1 kit by mouth as directed. 1 Bottle 0  . Probiotic Product (PROBIOTIC DAILY PO) Take 1 capsule by mouth daily.     . valACYclovir (VALTREX) 500 MG tablet Take 500 mg by mouth daily as needed (outbreak).     Marland Kitchen VITAMIN D PO Take 5,000 Units by mouth every 7 (seven) days.      Social History   Socioeconomic History  . Marital status: Married    Spouse name: Not on file  . Number of children: 3  . Years of education: Not on file  . Highest education level: Not on file  Occupational History  . Not on file  Tobacco Use  . Smoking status: Never Smoker  . Smokeless tobacco: Never Used  Vaping Use  . Vaping Use: Never used  Substance and Sexual Activity  . Alcohol use: Yes    Alcohol/week: 1.0 standard drink    Types: 1 Glasses of wine per  week  . Drug use: Never  . Sexual activity: Not on file  Other Topics Concern  . Not on file  Social History Narrative  . Not on file   Social Determinants of Health   Financial Resource Strain:   . Difficulty of Paying Living Expenses:   Food Insecurity:   . Worried About Charity fundraiser in the Last Year:   . Arboriculturist in the Last Year:   Transportation Needs:   . Film/video editor (Medical):   Marland Kitchen Lack of Transportation (Non-Medical):   Physical Activity:   . Days of Exercise per Week:   . Minutes of Exercise per Session:   Stress:   . Feeling of Stress :   Social Connections:   . Frequency of Communication with Friends and Family:   . Frequency of Social Gatherings with Friends and Family:   . Attends Religious Services:   . Active Member of Clubs or Organizations:   . Attends Archivist Meetings:   Marland Kitchen Marital Status:   Intimate Partner Violence:   . Fear of Current or Ex-Partner:   . Emotionally Abused:   Marland Kitchen Physically Abused:   . Sexually Abused:    Family History  Problem Relation Age of Onset  . Hypertension Mother   . Diabetes Mother  OBJECTIVE:  Vitals:   11/17/19 0847  BP: 129/73  Pulse: 63  Resp: 18  Temp: 98.4 F (36.9 C)  SpO2: 98%     General appearance: alert; well-appearing, nontoxic; speaking in full sentences and tolerating own secretions HEENT: NCAT; Ears: EACs clear; Eyes: PERRL.  EOM grossly intact.Nose: nares patent without rhinorrhea, Throat: oropharynx clear, tonsils non erythematous or enlarged, uvula midline  Neck: supple without LAD Lungs: unlabored respirations, symmetrical air entry; cough: absent; no respiratory distress; CTAB Heart: regular rate and rhythm.  Skin: warm and dry Psychological: alert and cooperative; normal mood and affect   ASSESSMENT & PLAN:  1. Encounter for laboratory testing for COVID-19 virus     COVID testing ordered.  It will take between 2-5 days for test results.  Someone  will contact you regarding abnormal results.    In the meantime:  If you were to develop symptoms: You should remain isolated in your home for 10 days from symptom onset AND greater than 72 hours after symptoms resolution (absence of fever without the use of fever-reducing medication and improvement in respiratory symptoms), whichever is longer If you have had exposure: you should remain in quarantine for 7 days from exposure.  However, if symptoms develop you must self isolate and return for retesting Get plenty of rest and push fluids Follow up with PCP as needed Call or go to the ED if you have any new symptoms such as fever, cough, shortness of breath, chest tightness, chest pain, turning blue, changes in mental status, etc...   Reviewed expectations re: course of current medical issues. Questions answered. Outlined signs and symptoms indicating need for more acute intervention. Patient verbalized understanding. After Visit Summary given.         Stacey Drain Seligman, PA-C 11/17/19 [phone]

## 2019-11-17 NOTE — Discharge Instructions (Signed)

## 2019-11-18 LAB — SARS-COV-2, NAA 2 DAY TAT

## 2019-11-18 LAB — NOVEL CORONAVIRUS, NAA: SARS-CoV-2, NAA: NOT DETECTED

## 2019-11-18 LAB — SPECIMEN STATUS REPORT

## 2020-04-25 ENCOUNTER — Ambulatory Visit
Admission: EM | Admit: 2020-04-25 | Discharge: 2020-04-25 | Disposition: A | Discharge status: Home / Self Care | Payer: BC Managed Care – PPO | Attending: Emergency Medicine | Admitting: Emergency Medicine

## 2020-04-25 ENCOUNTER — Other Ambulatory Visit: Payer: Self-pay

## 2020-04-25 DIAGNOSIS — Z1152 Encounter for screening for COVID-19: Secondary | ICD-10-CM

## 2020-04-25 DIAGNOSIS — J069 Acute upper respiratory infection, unspecified: Secondary | ICD-10-CM | POA: Diagnosis not present

## 2020-04-25 NOTE — Discharge Instructions (Addendum)
Recommend daily allergy medication like Zyrtec or Allegra Can take Mucinex.  COVID test pending.  If positive self isolate 5 days from symptom onset and then wear a mask around others for 5 days.  Push fluids Can take Ibuprofen or Tylenol for sore throat.

## 2020-04-25 NOTE — ED Provider Notes (Signed)
[facility]    CSN: [id number] Arrival date & time: 04/25/20  0950      History   Chief Complaint Chief Complaint  Patient presents with  . Sore Throat  . Nasal Congestion    HPI Evelyn Moyer is a 55 y.o. female.   Pt complains of nasal congestion and sore throat that started three days ago.  Reports she received her COVID booster this past Sunday.  Reports mild intermittent non productive cough.  She denies fever, chills, body aches.  She has taken nothing for the sx.  Denies recent sick contacts.      Past Medical History:  Diagnosis Date  . Dyspnea     There are no problems to display for this patient.   Past Surgical History:  Procedure Laterality Date  . APPENDECTOMY    . COLONOSCOPY N/A 03/30/2018   Procedure: COLONOSCOPY;  Surgeon: Daneil Dolin, MD;  Location: [facility];  Service: Endoscopy;  Laterality: N/A;  12:00  . TUBAL LIGATION      OB History   No obstetric history on file.      Home Medications    Prior to Admission medications   Medication Sig Start Date End Date Taking? Authorizing Provider  Na Sulfate-K Sulfate-Mg Sulf (SUPREP BOWEL PREP KIT) 17.5-3.13-1.6 GM/177ML SOLN Take 1 kit by mouth as directed. 01/25/18   Mahala Menghini, PA-C  Probiotic Product (PROBIOTIC DAILY PO) Take 1 capsule by mouth daily.     [provider]  valACYclovir (VALTREX) 500 MG tablet Take 500 mg by mouth daily as needed (outbreak).     [provider]  VITAMIN D PO Take 5,000 Units by mouth every 7 (seven) days.     [provider]    Family History Family History  Problem Relation Age of Onset  . Hypertension Mother   . Diabetes Mother     Social History Social History   Tobacco Use  . Smoking status: Never Smoker  . Smokeless tobacco: Never Used  Vaping Use  . Vaping Use: Never used  Substance Use Topics  . Alcohol use: Yes    Alcohol/week: 1.0 standard drink    Types: 1 Glasses of wine per week  .  Drug use: Never     Allergies   Patient has no known allergies.   Review of Systems Review of Systems  Constitutional: Negative for chills and fever.  HENT: Positive for congestion and sore throat. Negative for ear pain.   Eyes: Negative for pain and visual disturbance.  Respiratory: Positive for cough. Negative for shortness of breath and wheezing.   Cardiovascular: Negative for chest pain and palpitations.  Gastrointestinal: Negative for abdominal pain, diarrhea, nausea and vomiting.  Genitourinary: Negative for dysuria and hematuria.  Musculoskeletal: Negative for arthralgias and back pain.  Skin: Negative for color change and rash.  Neurological: Negative for seizures and syncope.  All other systems reviewed and are negative.    Physical Exam Triage Vital Signs ED Triage Vitals [04/25/20 1020]  Enc Vitals Group     BP 129/85     Pulse Rate 67     Resp 18     Temp 98.8 F (37.1 C)     Temp src      SpO2 98 %     Weight      Height      Head Circumference      Peak Flow      Pain Score  Pain Loc      Pain Edu?      Excl. in [place]?    No data found.  Updated Vital Signs BP 129/85   Pulse 67   Temp 98.8 F (37.1 C)   Resp 18   SpO2 98%   Visual Acuity Right Eye Distance:   Left Eye Distance:   Bilateral Distance:    Right Eye Near:   Left Eye Near:    Bilateral Near:     Physical Exam Vitals and nursing note reviewed.  Constitutional:      General: She is not in acute distress.    Appearance: She is well-developed and well-nourished.  HENT:     Head: Normocephalic and atraumatic.  Eyes:     Conjunctiva/sclera: Conjunctivae normal.  Cardiovascular:     Rate and Rhythm: Normal rate and regular rhythm.     Heart sounds: No murmur heard.   Pulmonary:     Effort: Pulmonary effort is normal. No respiratory distress.     Breath sounds: Normal breath sounds.  Abdominal:     Palpations: Abdomen is soft.     Tenderness: There is no abdominal  tenderness.  Musculoskeletal:        General: No edema.     Cervical back: Neck supple.  Skin:    General: Skin is warm and dry.  Neurological:     Mental Status: She is alert.  Psychiatric:        Mood and Affect: Mood and affect normal.      UC Treatments / Results  Labs (all labs ordered are listed, but only abnormal results are displayed) Labs Reviewed  COVID-19, FLU A+B NAA    EKG   Radiology No results found.  Procedures Procedures (including critical care time)  Medications Ordered in UC Medications - No data to display  Initial Impression / Assessment and Plan / UC Course  I have reviewed the triage vital signs and the nursing notes.  Pertinent labs & imaging results that were available during my care of the patient were reviewed by me and considered in my medical decision making (see chart for details).    Pt well appearing, vitals WNL. COVID test pending.  Self isolation instructions discussed.  Recommend supportive care with otc cold and cough medications, ibuprofen as needed. Strict return precautions given.  Final Clinical Impressions(s) / UC Diagnoses   Final diagnoses:  Acute upper respiratory infection     Discharge Instructions     Recommend daily allergy medication like Zyrtec or Allegra Can take Mucinex.  COVID test pending.  If positive self isolate 5 days from symptom onset and then wear a mask around others for 5 days.  Push fluids Can take Ibuprofen or Tylenol for sore throat.    ED Prescriptions    None     PDMP not reviewed this encounter.   Konrad Felix, PA-C 04/25/20 1037

## 2020-04-25 NOTE — ED Triage Notes (Signed)
Pt presents with c/o nasal congestion and scratchy throat that began thursday, received booster vaccine on Sunday

## 2020-04-30 LAB — COVID-19, FLU A+B NAA
Influenza A, NAA: NOT DETECTED
Influenza B, NAA: NOT DETECTED
SARS-CoV-2, NAA: DETECTED — AB

## 2020-06-19 ENCOUNTER — Ambulatory Visit
Admission: EM | Admit: 2020-06-19 | Discharge: 2020-06-19 | Disposition: A | Discharge status: Home / Self Care | Payer: BC Managed Care – PPO | Attending: Emergency Medicine | Admitting: Emergency Medicine

## 2020-06-19 ENCOUNTER — Other Ambulatory Visit: Payer: Self-pay

## 2020-06-19 ENCOUNTER — Encounter: Payer: Self-pay | Admitting: Emergency Medicine

## 2020-06-19 DIAGNOSIS — M546 Pain in thoracic spine: Secondary | ICD-10-CM | POA: Diagnosis not present

## 2020-06-19 MED ORDER — IBUPROFEN 600 MG PO TABS
600.0000 mg | ORAL_TABLET | Freq: Four times a day (QID) | ORAL | 0 refills | Status: DC | PRN
Start: 2020-06-19 — End: 2020-11-09

## 2020-06-19 MED ORDER — CYCLOBENZAPRINE HCL 10 MG PO TABS
10.0000 mg | ORAL_TABLET | Freq: Every day | ORAL | 0 refills | Status: DC
Start: 2020-06-19 — End: 2020-12-09

## 2020-06-19 MED ORDER — PREDNISONE 10 MG PO TABS
10.0000 mg | ORAL_TABLET | Freq: Every day | ORAL | 0 refills | Status: AC
Start: 2020-06-19 — End: 2020-06-26

## 2020-06-19 NOTE — ED Provider Notes (Signed)
[place]   [id number] 06/19/20 Arrival Time: 1943   Chief Complaint  Patient presents with  . Back Pain   SUBJECTIVE: History from: patient.  Evelyn Moyer is a 55 y.o. 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  Localized pain to the left upper back.  Has tried OTC medication without relief.  Denies alleviating or aggravating factors.  Denies similar symptoms in the past.   Denies fever, chills, fatigue, sinus pain, rhinorrhea, sore throat, SOB, wheezing, chest pain, nausea, changes in bowel or bladder habits.      ROS: As per HPI.  All other pertinent ROS negative.     Past Medical History:  Diagnosis Date  . Dyspnea    Past Surgical History:  Procedure Laterality Date  . APPENDECTOMY    . COLONOSCOPY N/A 03/30/2018   Procedure: COLONOSCOPY;  Surgeon: Daneil Dolin, MD;  Location: [facility];  Service: Endoscopy;  Laterality: N/A;  12:00  . TUBAL LIGATION     No Known Allergies No current facility-administered medications on file prior to encounter.   Current Outpatient Medications on File Prior to Encounter  Medication Sig Dispense Refill  . Na Sulfate-K Sulfate-Mg Sulf (SUPREP BOWEL PREP KIT) 17.5-3.13-1.6 GM/177ML SOLN Take 1 kit by mouth as directed. 1 Bottle 0  . Probiotic Product (PROBIOTIC DAILY PO) Take 1 capsule by mouth daily.     . valACYclovir (VALTREX) 500 MG tablet Take 500 mg by mouth daily as needed (outbreak).     Marland Kitchen VITAMIN D PO Take 5,000 Units by mouth every 7 (seven) days.      Social History   Socioeconomic History  . Marital status: Married    Spouse name: Not on file  . Number of children: 3  . Years of education: Not on file  . Highest education level: Not on file  Occupational History  . Not on file  Tobacco Use  . Smoking status: Never Smoker  . Smokeless tobacco: Never Used  Vaping Use  .  Vaping Use: Never used  Substance and Sexual Activity  . Alcohol use: Yes    Alcohol/week: 1.0 standard drink    Types: 1 Glasses of wine per week  . Drug use: Never  . Sexual activity: Not on file  Other Topics Concern  . Not on file  Social History Narrative  . Not on file   Social Determinants of Health   Financial Resource Strain: Not on file  Food Insecurity: Not on file  Transportation Needs: Not on file  Physical Activity: Not on file  Stress: Not on file  Social Connections: Not on file  Intimate Partner Violence: Not on file   Family History  Problem Relation Age of Onset  . Hypertension Mother   . Diabetes Mother     OBJECTIVE:  Vitals:   06/19/20 1948 06/19/20 2005  BP: (!) 161/69 (!) 149/74  Pulse: 72   Resp: 18   Temp: 98.1 F (36.7 C)   TempSrc: Oral   SpO2: 98%      Physical Exam Vitals and nursing note reviewed.  Constitutional:      General: She is not in acute distress.    Appearance: Normal appearance. She is normal weight. She is not ill-appearing, toxic-appearing or diaphoretic.  HENT:     Head: Normocephalic.  Cardiovascular:  Rate and Rhythm: Normal rate and regular rhythm.     Pulses: Normal pulses.     Heart sounds: Normal heart sounds. No murmur heard. No friction rub. No gallop.   Pulmonary:     Effort: Pulmonary effort is normal. No respiratory distress.     Breath sounds: Normal breath sounds. No stridor. No wheezing, rhonchi or rales.  Chest:     Chest wall: No tenderness.  Musculoskeletal:     Thoracic back: Spasms and tenderness present.       Back:     Comments: Tenderness point with spasm  Neurological:     Mental Status: She is alert and oriented to person, place, and time.     LABS:  No results found for this or any previous visit (from the past 24 hour(s)).   ASSESSMENT & PLAN:  1. Acute left-sided thoracic back pain     Meds ordered this encounter  Medications  . ibuprofen (ADVIL) 600 MG tablet     Sig: Take 1 tablet (600 mg total) by mouth every 6 (six) hours as needed.    Dispense:  30 tablet    Refill:  0  . cyclobenzaprine (FLEXERIL) 10 MG tablet    Sig: Take 1 tablet (10 mg total) by mouth at bedtime.    Dispense:  20 tablet    Refill:  0  . predniSONE (DELTASONE) 10 MG tablet    Sig: Take 1 tablet (10 mg total) by mouth daily for 7 days.    Dispense:  7 tablet    Refill:  0    Discharge instructions  Rest, ice and heat as needed Ensure adequate ROM as tolerated. Prescribed ibuprofen as needed for pain relief Prescribed flexeril  for muscle spasm.  Do not drive or operate heavy machinery while taking this medication Return here or go to ER if you have any new or worsening symptoms such as numbness/tingling, headache/blurry vision, nausea/vomiting, confusion/altered mental status, dizziness, weakness, passing out, imbalance, etc...    Reviewed expectations re: course of current medical issues. Questions answered. Outlined signs and symptoms indicating need for more acute intervention. Patient verbalized understanding. After Visit Summary given.         Emerson Monte, FNP 06/19/20 2007

## 2020-06-19 NOTE — Discharge Instructions (Addendum)
Rest, ice and heat as needed Ensure adequate ROM as tolerated. Prescribed ibuprofen as needed for pain relief Prescribed flexeril  for muscle spasm.  Do not drive or operate heavy machinery while taking this medication Return here or go to ER if you have any new or worsening symptoms such as numbness/tingling, headache/blurry vision, nausea/vomiting, confusion/altered mental status, dizziness, weakness, passing out, imbalance, etc..Marland Kitchen

## 2020-06-19 NOTE — ED Triage Notes (Signed)
Left sided upper back pain x 1 week.

## 2020-09-28 DIAGNOSIS — Z01419 Encounter for gynecological examination (general) (routine) without abnormal findings: Secondary | ICD-10-CM | POA: Diagnosis not present

## 2020-09-28 DIAGNOSIS — Z760 Encounter for issue of repeat prescription: Secondary | ICD-10-CM | POA: Diagnosis not present

## 2020-09-28 DIAGNOSIS — Z1231 Encounter for screening mammogram for malignant neoplasm of breast: Secondary | ICD-10-CM | POA: Diagnosis not present

## 2020-09-28 DIAGNOSIS — Z124 Encounter for screening for malignant neoplasm of cervix: Secondary | ICD-10-CM | POA: Diagnosis not present

## 2020-10-15 DIAGNOSIS — Z23 Encounter for immunization: Secondary | ICD-10-CM | POA: Diagnosis not present

## 2020-10-15 DIAGNOSIS — M25562 Pain in left knee: Secondary | ICD-10-CM | POA: Diagnosis not present

## 2020-10-15 DIAGNOSIS — Z0000 Encounter for general adult medical examination without abnormal findings: Secondary | ICD-10-CM | POA: Diagnosis not present

## 2020-10-15 DIAGNOSIS — M25561 Pain in right knee: Secondary | ICD-10-CM | POA: Diagnosis not present

## 2020-10-15 DIAGNOSIS — Z13220 Encounter for screening for lipoid disorders: Secondary | ICD-10-CM | POA: Diagnosis not present

## 2020-10-21 ENCOUNTER — Encounter: Payer: Self-pay | Admitting: Orthopedic Surgery

## 2020-10-23 ENCOUNTER — Emergency Department (HOSPITAL_BASED_OUTPATIENT_CLINIC_OR_DEPARTMENT_OTHER): Payer: BC Managed Care – PPO

## 2020-10-23 ENCOUNTER — Encounter (HOSPITAL_BASED_OUTPATIENT_CLINIC_OR_DEPARTMENT_OTHER): Payer: Self-pay | Admitting: Emergency Medicine

## 2020-10-23 ENCOUNTER — Other Ambulatory Visit: Payer: Self-pay

## 2020-10-23 ENCOUNTER — Emergency Department (HOSPITAL_BASED_OUTPATIENT_CLINIC_OR_DEPARTMENT_OTHER)
Admission: EM | Admit: 2020-10-23 | Discharge: 2020-10-23 | Disposition: A | Discharge status: Home / Self Care | Payer: BC Managed Care – PPO | Attending: Emergency Medicine | Admitting: Emergency Medicine

## 2020-10-23 DIAGNOSIS — M25561 Pain in right knee: Secondary | ICD-10-CM | POA: Diagnosis not present

## 2020-10-23 DIAGNOSIS — M25462 Effusion, left knee: Secondary | ICD-10-CM | POA: Insufficient documentation

## 2020-10-23 DIAGNOSIS — M25562 Pain in left knee: Secondary | ICD-10-CM | POA: Diagnosis not present

## 2020-10-23 MED ORDER — HYDROCODONE-ACETAMINOPHEN 5-325 MG PO TABS: 1.0000 | ORAL_TABLET | Freq: Four times a day (QID) | ORAL | 0 refills | Status: DC | PRN

## 2020-10-23 MED ORDER — IBUPROFEN 800 MG PO TABS
800.0000 mg | ORAL_TABLET | Freq: Once | ORAL | Status: AC
  Administered 2020-10-23: 800 mg via ORAL
  Filled 2020-10-23: qty 1

## 2020-10-23 NOTE — Discharge Instructions (Addendum)
1.  Make a follow-up appointment with [facility] for further evaluation of your knee swelling. 2.  Follow instructions for elevating, icing and wearing an Ace wrap when up and moving around for compressing your knee. 3.  Return to the emergency department for recheck if you develop redness, fever, suddenly increasing pain or other concerning symptoms. 4.  Your blood pressure is mildly elevated in the emergency department.  This will need to be rechecked.  Anti-inflammatory pain medication such as Aleve, ibuprofen, Motrin can worsen high blood pressure.  At this time, avoid these medications and use either plain acetaminophen extra strength every 6 hours or the prescribed Vicodin tablets 1-2 every 6 hours for pain.

## 2020-10-23 NOTE — ED Provider Notes (Signed)
[facility] Provider Note   CSN: [id number] Arrival date & time: 10/23/20  1450     History Chief Complaint  Patient presents with   Knee Pain    Evelyn Moyer is a 55 y.o. female.  HPI Patient reports she is had problems with knee pain previously.  She reports she does have arthritis in her knees.  Typically both knees are uncomfortable or it migrates back-and-forth.  Patient however reports she is quite active.  She works out.  She does variable exercises including cardio, yoga and sometimes weights.  She reports for the past 4 days her left knee has been bothering her a lot.  She reports that she has tried elevating and icing and it almost seem to get worse.  She reports she is got pain behind the knee and it radiates a little bit down her calf and up the back of her leg.  She reports the knee seem to be a little more painful to the front a day or so ago.  Now, she reports is difficult and stiff and uncomfortable to try to bend her knee all the way.  No numbness or tingling into the foot or the lower leg.  Patient denies has been experiencing any chest pain, no shortness of breath, no fever no chills no cough.    Past Medical History:  Diagnosis Date   Dyspnea     There are no problems to display for this patient.   Past Surgical History:  Procedure Laterality Date   APPENDECTOMY     COLONOSCOPY N/A 03/30/2018   Procedure: COLONOSCOPY;  Surgeon: Daneil Dolin, MD;  [facility];  Service: Endoscopy;  Laterality: N/A;  12:00   TUBAL LIGATION       OB History   No obstetric history on file.     Family History  Problem Relation Age of Onset   Hypertension Mother    Diabetes Mother     Social History   Tobacco Use   Smoking status: Never   Smokeless tobacco: Never  Vaping Use   Vaping Use: Never used  Substance Use Topics   Alcohol use: Yes    Alcohol/week: 1.0 standard drink    Types: 1 Glasses of wine per week   Drug  use: Never    Home Medications Prior to Admission medications   Medication Sig Start Date End Date Taking? Authorizing Provider  HYDROcodone-acetaminophen (NORCO/VICODIN) 5-325 MG tablet Take 1-2 tablets by mouth every 6 (six) hours as needed for moderate pain or severe pain. 10/23/20  Yes Charlesetta Shanks, MD  cyclobenzaprine (FLEXERIL) 10 MG tablet Take 1 tablet (10 mg total) by mouth at bedtime. 06/19/20   Avegno, Darrelyn Hillock, FNP  ibuprofen (ADVIL) 600 MG tablet Take 1 tablet (600 mg total) by mouth every 6 (six) hours as needed. 06/19/20   Avegno, Darrelyn Hillock, FNP  Na Sulfate-K Sulfate-Mg Sulf (SUPREP BOWEL PREP KIT) 17.5-3.13-1.6 GM/177ML SOLN Take 1 kit by mouth as directed. 01/25/18   Mahala Menghini, PA-C  Probiotic Product (PROBIOTIC DAILY PO) Take 1 capsule by mouth daily.     [provider]  valACYclovir (VALTREX) 500 MG tablet Take 500 mg by mouth daily as needed (outbreak).     [provider]  VITAMIN D PO Take 5,000 Units by mouth every 7 (seven) days.     [provider]    Allergies    Patient has no known allergies.  Review of Systems  Review of Systems Constitutional: No fever no chills no malaise Respiratory: No cough no shortness of breath no chest pain Neurologic: No weakness numbness or tingling Physical Exam Updated Vital Signs BP (!) 168/81   Pulse (!) 50   Temp 98.3 F (36.8 C) (Oral)   Resp 16   Ht $R'5\' 1"'kW$  (1.549 m)   Wt 53.1 kg   SpO2 94%   BMI 22.11 kg/m   Physical Exam Constitutional:      Appearance: Normal appearance.  Eyes:     Extraocular Movements: Extraocular movements intact.  Cardiovascular:     Rate and Rhythm: Normal rate and regular rhythm.  Pulmonary:     Effort: Pulmonary effort is normal.     Breath sounds: Normal breath sounds.  Musculoskeletal:     Comments: Both knees are grossly normal in appearance.  Possible subtle effusion\fullness in the left knee.  Both knees have slight crepitus with movement of  the patella.  No redness or warmth to the left knee.  Some tenderness to the popliteal fossa on the left.  Calf is soft and pliable.  No edema of the lower legs.  Feet are symmetric.  Warm and dry.  Symmetric dorsalis pedis pulses.  Some discomfort with full flexion of the left knee.  Skin:    General: Skin is warm and dry.  Neurological:     General: No focal deficit present.     Mental Status: She is alert and oriented to person, place, and time.     Motor: No weakness.  Psychiatric:        Mood and Affect: Mood normal.    ED Results / Procedures / Treatments   Labs (all labs ordered are listed, but only abnormal results are displayed) Labs Reviewed - No data to display  EKG None  Radiology US Venous Img Lower  Left (DVT Study)  Result Date: 10/23/2020 CLINICAL DATA:  Left knee pain EXAM: Left LOWER EXTREMITY VENOUS DOPPLER ULTRASOUND TECHNIQUE: Gray-scale sonography with compression, as well as color and duplex ultrasound, were performed to evaluate the deep venous system(s) from the level of the common femoral vein through the popliteal and proximal calf veins. COMPARISON:  None. FINDINGS: VENOUS Normal compressibility of the common femoral, superficial femoral, and popliteal veins, as well as the visualized calf veins. Visualized portions of profunda femoral vein and great saphenous vein unremarkable. No filling defects to suggest DVT on grayscale or color Doppler imaging. Doppler waveforms show normal direction of venous flow, normal respiratory plasticity and response to augmentation. Limited views of the contralateral common femoral vein are unremarkable. OTHER Small fluid collection at the anterolateral knee measuring 4.6 x 0.7 x 2 cm with scattered internal echoes. Limitations: none IMPRESSION: Negative for acute DVT in the left lower extremity. Fluid collection in the left anterolateral knee. Electronically Signed   By: Donavan Foil M.D.   On: 10/23/2020 20:04   DG Knee Complete 4  Views Left  Result Date: 10/23/2020 CLINICAL DATA:  Knee pain for 2 years EXAM: LEFT KNEE - COMPLETE 4+ VIEW COMPARISON:  None. FINDINGS: No fracture or malalignment. Mild to moderate tricompartment arthritis, most advanced involving the medial and patellofemoral compartments. Small knee effusion. IMPRESSION: Tricompartment arthritis with knee effusion. Electronically Signed   By: Donavan Foil M.D.   On: 10/23/2020 15:50    Procedures Procedures   Medications Ordered in ED Medications  ibuprofen (ADVIL) tablet 800 mg (800 mg Oral Given 10/23/20 1917)    ED Course  I have  reviewed the triage vital signs and the nursing notes.  Pertinent labs & imaging results that were available during my care of the patient were reviewed by me and considered in my medical decision making (see chart for details).    MDM Rules/Calculators/A&P                          Patient has findings consistent with a arthritic knee effusion.  There is no redness or warmth to suggest infection.  Patient is active and has had some mild problems with arthritis in both knees variably.  She did have pain behind the knee and radiating somewhat into the calf and upper leg.  DVT is ruled out.  Small effusion identified.  At this time I do not feel there is adequate effusion to merit aspiration.  Plan will be for ice, elevating and limiting activity at this time.  Patient is instructed on following up with orthopedics for further diagnostic and therapeutic treatment.  Patient did have mild elevated blood pressure during her visit.  She is completely asymptomatic.  She reports blood pressures are typically normal.  At this time recommendations for further surveillance by PCP when patient is not in pain.  Patient is counseled on avoiding NSAIDs at this time until further evaluation of her blood pressures this might accelerate blood pressure elevation.  Currently will recommend pain control with acetaminophen or Vicodin for pain as  needed. Final Clinical Impression(s) / ED Diagnoses Final diagnoses:  Effusion, left knee    Rx / DC Orders ED Discharge Orders          Ordered    HYDROcodone-acetaminophen (NORCO/VICODIN) 5-325 MG tablet  Every 6 hours PRN        10/23/20 2123             Charlesetta Shanks, MD 10/23/20 2129

## 2020-10-23 NOTE — ED Notes (Signed)
Pt verbalizes understanding of discharge instructions. Opportunity for questioning and answers were provided. Armand removed by staff, pt discharged from ED to home. Educated to pick up Rx and RICE knee

## 2020-10-23 NOTE — ED Triage Notes (Signed)
Bilateral knee pain for years. The left one hurts worse over the past few days. The pain radiates into her foot.

## 2020-11-02 DIAGNOSIS — M25562 Pain in left knee: Secondary | ICD-10-CM | POA: Diagnosis not present

## 2020-11-03 ENCOUNTER — Ambulatory Visit: Payer: BC Managed Care – PPO | Admitting: Orthopaedic Surgery

## 2020-11-07 ENCOUNTER — Emergency Department (HOSPITAL_COMMUNITY)
Admission: EM | Admit: 2020-11-07 | Discharge: 2020-11-07 | Disposition: A | Discharge status: Home / Self Care | Payer: BC Managed Care – PPO | Attending: Emergency Medicine | Admitting: Emergency Medicine

## 2020-11-07 ENCOUNTER — Encounter (HOSPITAL_COMMUNITY): Payer: Self-pay | Admitting: *Deleted

## 2020-11-07 ENCOUNTER — Other Ambulatory Visit: Payer: Self-pay

## 2020-11-07 DIAGNOSIS — X501XXA Overexertion from prolonged static or awkward postures, initial encounter: Secondary | ICD-10-CM | POA: Diagnosis not present

## 2020-11-07 DIAGNOSIS — M25562 Pain in left knee: Secondary | ICD-10-CM | POA: Insufficient documentation

## 2020-11-07 DIAGNOSIS — M25561 Pain in right knee: Secondary | ICD-10-CM | POA: Diagnosis not present

## 2020-11-07 MED ORDER — FENTANYL CITRATE (PF) 100 MCG/2ML IJ SOLN
50.0000 ug | Freq: Once | INTRAMUSCULAR | Status: AC
  Administered 2020-11-07: 50 ug via INTRAMUSCULAR
  Filled 2020-11-07: qty 2

## 2020-11-07 MED ORDER — HYDROCODONE-ACETAMINOPHEN 5-325 MG PO TABS
1.0000 | ORAL_TABLET | Freq: Once | ORAL | Status: AC
  Administered 2020-11-07: 1 via ORAL
  Filled 2020-11-07: qty 1

## 2020-11-07 NOTE — ED Provider Notes (Signed)
[facility] EMERGENCY DEPARTMENT Provider Note   CSN: [id number] Arrival date & time: 11/07/20  1411     History Chief Complaint  Patient presents with   Knee Pain    Left     Evelyn Moyer is a 55 y.o. female.  HPI  55 year old female with a history of dyspnea presents to the emergency department today for evaluation of knee pain.  States she has had pain to the bilateral knees for she is mainly here today due to left knee pain.  She was seen in the ED last week for pain in the left knee and had an x-ray and ultrasound at that time.  X-ray showed tricompartment apartment arthritis with left knee effusion.  Ultrasound not show any evidence for DVT but did show fluid collection to the left anterolateral knee.  States that at that time she was given Rx for Vicodin but she has been hesitant to take it.  She was given an Ace wrap as well and later followed up with [facility] who repeated the x-ray, told her that she got arthritis and started her on Celebrex.  States that the Celebrex has not improved symptoms and today when walking up the stairs she felt a pop and since then her pain is been worse.  There have been no other associated symptoms.  Past Medical History:  Diagnosis Date   Dyspnea     There are no problems to display for this patient.   Past Surgical History:  Procedure Laterality Date   APPENDECTOMY     COLONOSCOPY N/A 03/30/2018   Procedure: COLONOSCOPY;  Surgeon: Daneil Dolin, MD;  Location: [facility];  Service: Endoscopy;  Laterality: N/A;  12:00   TUBAL LIGATION       OB History   No obstetric history on file.     Family History  Problem Relation Age of Onset   Hypertension Mother    Diabetes Mother     Social History   Tobacco Use   Smoking status: Never   Smokeless tobacco: Never  Vaping Use   Vaping Use: Never used  Substance Use Topics   Alcohol use: Yes    Alcohol/week: 1.0 standard drink    Types: 1 Glasses of wine per week   Drug  use: Never    Home Medications Prior to Admission medications   Medication Sig Start Date End Date Taking? Authorizing Provider  cyclobenzaprine (FLEXERIL) 10 MG tablet Take 1 tablet (10 mg total) by mouth at bedtime. 06/19/20   Avegno, Darrelyn Hillock, FNP  HYDROcodone-acetaminophen (NORCO/VICODIN) 5-325 MG tablet Take 1-2 tablets by mouth every 6 (six) hours as needed for moderate pain or severe pain. 10/23/20   Charlesetta Shanks, MD  ibuprofen (ADVIL) 600 MG tablet Take 1 tablet (600 mg total) by mouth every 6 (six) hours as needed. 06/19/20   Avegno, Darrelyn Hillock, FNP  Na Sulfate-K Sulfate-Mg Sulf (SUPREP BOWEL PREP KIT) 17.5-3.13-1.6 GM/177ML SOLN Take 1 kit by mouth as directed. 01/25/18   Mahala Menghini, PA-C  Probiotic Product (PROBIOTIC DAILY PO) Take 1 capsule by mouth daily.     [provider]  valACYclovir (VALTREX) 500 MG tablet Take 500 mg by mouth daily as needed (outbreak).     [provider]  VITAMIN D PO Take 5,000 Units by mouth every 7 (seven) days.     [provider]    Allergies    Patient has no known allergies.  Review of Systems   Review of  Systems  Constitutional:  Negative for fever.  Musculoskeletal:        Left knee pain   Physical Exam Updated Vital Signs BP (!) 148/55 (BP Location: Left Arm)   Pulse (!) 49   Temp 98 F (36.7 C)   Resp 16   Ht 5' (1.524 m)   Wt 53.1 kg   SpO2 100%   BMI 22.85 kg/m   Physical Exam Vitals and nursing note reviewed.  Constitutional:      General: She is not in acute distress.    Appearance: She is well-developed.  HENT:     Head: Normocephalic and atraumatic.  Eyes:     Conjunctiva/sclera: Conjunctivae normal.  Cardiovascular:     Rate and Rhythm: Normal rate.  Pulmonary:     Effort: Pulmonary effort is normal.  Musculoskeletal:        General: Normal range of motion.     Cervical back: Neck supple.     Comments: TTP to the left knee along the lateral joint line. There is no effusion,  erythema or warmth noted. No calf TTP noted. Distal extremities warm and well perfused. Decreased ROM noted secondary to pain.   Skin:    General: Skin is warm and dry.  Neurological:     Mental Status: She is alert.    ED Results / Procedures / Treatments   Labs (all labs ordered are listed, but only abnormal results are displayed) Labs Reviewed - No data to display  EKG None  Radiology No results found.  Procedures Procedures   Medications Ordered in ED Medications  HYDROcodone-acetaminophen (NORCO/VICODIN) 5-325 MG per tablet 1 tablet (1 tablet Oral Given 11/07/20 1511)  fentaNYL (SUBLIMAZE) injection 50 mcg (50 mcg Intramuscular Given 11/07/20 1511)    ED Course  I have reviewed the triage vital signs and the nursing notes.  Pertinent labs & imaging results that were available during my care of the patient were reviewed by me and considered in my medical decision making (see chart for details).    MDM Rules/Calculators/A&P                          55 y/o f presenting for eval of left knee pain ongoing for several weeks but worse today. Has followed with [facility] but requesting referral for alternate orthopedic provider as she would like a second opinion.   We discussed repeat imaging though feel this would likely not reveal any new information given she has just recently had 2 xrays of the knee. She is in agreement with this plan.  Patient was given a dose of fentanyl and additional dose of Vicodin here in the ED and on reassessment she states she has had some improvement.  She was able to ambulate here in the ED.  I suspect that her symptoms today are likely a continuation of her recent diagnosis of arthritis.  We also discussed the possibility of a meniscal tear or other etiology of symptoms.  I have low suspicion for septic arthritis at this time or of another emergent process.  Will give knee immobilizer.  We will also give referral to Dr. Ruthe Mannan office.  Have  advised on close follow-up and strict return precautions.  She voices understanding of the plan and reasons to return.  All questions answered.  Patient stable for discharge    Final Clinical Impression(s) / ED Diagnoses Final diagnoses:  Acute pain of left knee    Rx /  DC Orders ED Discharge Orders     None        Bishop Dublin 11/07/20 1626    Milton Ferguson, MD 11/09/20 1042

## 2020-11-07 NOTE — ED Notes (Signed)
Ambulated about 20 feet.  Able to bare some weight but pain increases to 5/10.

## 2020-11-07 NOTE — Discharge Instructions (Addendum)
Tinea the Celebrex as prescribed and you may also take the Vicodin that you have been prescribed.  Use ice for 20 minutes at a time to help with the swelling and pain.  Please follow-up with the orthopedic doctor within the next several days for reassessment and return to the emergency department for any new or worsening symptoms.

## 2020-11-07 NOTE — ED Triage Notes (Signed)
Pt c/o left knee pain; pt states she has been to ortho doctor and given pain meds with no relief; pt states she twisted her knee this am and heard a "pop" and is now unable to walk

## 2020-11-09 ENCOUNTER — Telehealth: Payer: Self-pay | Admitting: Orthopaedic Surgery

## 2020-11-09 ENCOUNTER — Ambulatory Visit (INDEPENDENT_AMBULATORY_CARE_PROVIDER_SITE_OTHER): Payer: BC Managed Care – PPO | Admitting: Orthopedic Surgery

## 2020-11-09 ENCOUNTER — Encounter: Payer: Self-pay | Admitting: Orthopedic Surgery

## 2020-11-09 ENCOUNTER — Other Ambulatory Visit: Payer: Self-pay

## 2020-11-09 ENCOUNTER — Telehealth: Payer: Self-pay | Admitting: Radiology

## 2020-11-09 VITALS — BP 159/64 | HR 66 | Ht 60.0 in | Wt 117.0 lb

## 2020-11-09 DIAGNOSIS — M1712 Unilateral primary osteoarthritis, left knee: Secondary | ICD-10-CM

## 2020-11-09 DIAGNOSIS — G8929 Other chronic pain: Secondary | ICD-10-CM

## 2020-11-09 NOTE — Telephone Encounter (Signed)
Patient aware/arrived to appointment as scheduled.

## 2020-11-09 NOTE — Progress Notes (Signed)
New patient  Chief complaint pain left knee  History this is a 55 year old female who comes in with worsening left knee pain.  She has had pain for 5 to 6 years which she is dealt with.  She has remained active exercising  Recently she had increased pain after a injury coming off some jet skis.  She was seen by [facility] she was placed on Celebrex given a prescription for Vicodin if she needed.  She did not take the Vicodin  On Saturday she was going up some steps the knee pop she had acute pain and had to go back to the emergency room.  X-rays were taken there and they were noted to show arthritis advanced for age  She presents now walking with a crutch still having moderate pain in her left knee.  No prior history of surgery.  No history of athletics other than cheerleading  Does not smoke nondiabetic no history of hypertension no blood thinners   Past Medical History:  Diagnosis Date   Dyspnea    Past Surgical History:  Procedure Laterality Date   APPENDECTOMY     COLONOSCOPY N/A 03/30/2018   Procedure: COLONOSCOPY;  Surgeon: Corbin Ade, MD;  Location: [facility];  Service: Endoscopy;  Laterality: N/A;  12:00   TUBAL LIGATION      BP (!) 159/64   Pulse 66   Ht 5' (1.524 m)   Wt 117 lb (53.1 kg)   BMI 22.85 kg/m    She is awake and alert she is oriented x3  Mood and affect is pleasant  Gait is notable for a limp favoring the left knee with a crutch noted  The right knee is noted to have no effusion no tenderness full range of motion no ligament instability and normal muscle tone  The left knee is quite tender flexes to about 45 degrees tender especially over the medial joint line there is no effusion muscle tone is normal the knee feels stable to Lachman testing  X-ray from [facility] shows osteoarthritis of the left knee advanced for age there are secondary bone changes there is narrowing of the joint spaces especially medially.  She does not appear to have  any significant varus deformity  Assessment and plan  Osteoarthritis with acute pain possible meniscal tear versus loose body  Recommend hinged knee brace active range of motion continue Celebrex take Vicodin as needed  Recommend MRI to rule out loose body or meniscal tear  As I discussed with Ms. Flemmer and her husband who is an attorney it is unclear why she has such advanced disease for her age but no risk factors including no family history.  She may have some type of mechanical issue in the knee and that is why were getting the MRI however the end result of this knee will be knee replacement the question of how long the knee can last and what other interventions we can use such as hyaluronic acid cortisone injection platelet rich plasma bracing  A knee arthroscopy could be something that could buy her more time if we find a mechanical problem in the joint

## 2020-11-09 NOTE — Telephone Encounter (Signed)
Not enough conservative care for the MRI scan According to [employer]  Do you want to do peer to peer or do you want to order physical therapy and see back in 6 weeks?   [phone]   Case [phone] [id number]

## 2020-11-09 NOTE — Patient Instructions (Signed)
While we are working on your approval for MRI please go ahead and call to schedule your appointment with [facility] within at least one (1) week.   Central Scheduling [phone]  

## 2020-11-09 NOTE — Telephone Encounter (Signed)
Call to patient per message from Dr Romeo Apple; reached voice mail, left message.

## 2020-11-11 ENCOUNTER — Telehealth: Payer: Self-pay | Admitting: Radiology

## 2020-11-11 NOTE — Telephone Encounter (Signed)
Left message for her to call back, to make sure she has number to call and schedule the appointment for her MRI [phone]   The MRI approved, she just needs to call and make appointment   She also needs an appointment to review study  Can you give number to call and schedule and make a follow up appointment for Dr Romeo Apple week of 15th to review study?? If she calls back?

## 2020-11-23 ENCOUNTER — Ambulatory Visit (HOSPITAL_COMMUNITY)
Admission: RE | Admit: 2020-11-23 | Discharge: 2020-11-23 | Disposition: A | Discharge status: Home / Self Care | Payer: BC Managed Care – PPO | Source: Ambulatory Visit | Attending: Orthopedic Surgery | Admitting: Orthopedic Surgery

## 2020-11-23 ENCOUNTER — Other Ambulatory Visit: Payer: Self-pay

## 2020-11-23 DIAGNOSIS — M25561 Pain in right knee: Secondary | ICD-10-CM | POA: Insufficient documentation

## 2020-11-23 DIAGNOSIS — G8929 Other chronic pain: Secondary | ICD-10-CM | POA: Diagnosis not present

## 2020-11-23 DIAGNOSIS — M25562 Pain in left knee: Secondary | ICD-10-CM | POA: Diagnosis not present

## 2020-11-24 ENCOUNTER — Telehealth: Payer: Self-pay | Admitting: Orthopedic Surgery

## 2020-11-24 NOTE — Telephone Encounter (Signed)
Celebrex nausea  She stopped it   Its not constant pain   Ibuprofen 800 mg try for pain take with food

## 2020-11-30 ENCOUNTER — Encounter: Payer: Self-pay | Admitting: Orthopedic Surgery

## 2020-11-30 ENCOUNTER — Ambulatory Visit (INDEPENDENT_AMBULATORY_CARE_PROVIDER_SITE_OTHER): Payer: BC Managed Care – PPO | Admitting: Orthopedic Surgery

## 2020-11-30 ENCOUNTER — Other Ambulatory Visit: Payer: Self-pay

## 2020-11-30 ENCOUNTER — Telehealth: Payer: Self-pay | Admitting: Radiology

## 2020-11-30 VITALS — BP 146/47 | HR 63 | Ht 60.0 in | Wt 116.6 lb

## 2020-11-30 DIAGNOSIS — S83242D Other tear of medial meniscus, current injury, left knee, subsequent encounter: Secondary | ICD-10-CM

## 2020-11-30 DIAGNOSIS — M1712 Unilateral primary osteoarthritis, left knee: Secondary | ICD-10-CM | POA: Diagnosis not present

## 2020-11-30 DIAGNOSIS — M1710 Unilateral primary osteoarthritis, unspecified knee: Secondary | ICD-10-CM

## 2020-11-30 NOTE — Telephone Encounter (Signed)
She has now decided to change back to September 6th, cancel request for new post op date

## 2020-11-30 NOTE — Progress Notes (Signed)
MRI F/U  Chief Complaint  Patient presents with   Results    MRI left knee   Encounter Diagnoses  Name Primary?   Acute medial meniscus tear of left knee, subsequent encounter Yes   Primary localized osteoarthritis of knee left knee     Evelyn Moyer complains of pain primarily at night she is not taking any medication at this time she does complain of some right thigh pain with a cool feeling way across the right knee and some increased right knee pain  Her left knee is slightly better in the brace  We discussed the treatment options based on the MRI findings which show torn medial meniscus arthritis all 3 compartments strain of the medial collateral ligament  After discussion with her husband present using models and showing MRI pictures they have decided to proceed with arthroscopy of the left knee partial medial meniscectomy with known risks of possibly worsening possibly needing further treatment for the arthritis  Plan arthroscopy left knee partial medial meniscectomy

## 2020-11-30 NOTE — Telephone Encounter (Signed)
She decided to do the surgery this Friday 26th  When do you want her first post op?

## 2020-11-30 NOTE — Patient Instructions (Addendum)
Your surgery will be at [facility] by Dr Romeo Apple  The hospital will contact you with a preoperative appointment to discuss Anesthesia. Please bring your medications with you for the appointment. They will tell you the arrival time and medication instructions when you have your preoperative evaluation. Do not wear nail polish the day of your surgery and if you take Phentermine you need to stop this medication ONE WEEK prior to your surgery.   Out of work Aug 26th through  Aug 31st Return to work Sept 1 Meniscus Injury, Arthroscopy   Arthroscopy is a surgical procedure that involves the use of a small scope that has a camera and surgical instruments on the end (arthroscope). An arthroscope can be used to repair your meniscus injury.  LET [facility] CARE PROVIDER KNOW ABOUT: Any allergies you have. All medicines you are taking, including vitamins, herbs, eyedrops, creams, and over-the-counter medicines. Any recent colds or infections you have had or currently have. Previous problems you or members of your family have had with the use of anesthetics. Any blood disorders or blood clotting problems you have. Previous surgeries you have had. Medical conditions you have. RISKS AND COMPLICATIONS Generally, this is a safe procedure. However, as with any procedure, problems can occur. Possible problems include: Damage to nerves or blood vessels. Excess bleeding. Blood clots. Infection. BEFORE THE PROCEDURE Do not eat or drink for 6-8 hours before the procedure. Take medicines as directed by your surgeon. Ask your surgeon about changing or stopping your regular medicines. You may have lab tests the morning of surgery. PROCEDURE  You will be given one of the following:  A medicine that numbs the area (local anesthesia). A medicine that makes you go to sleep (general anesthesia). A medicine injected into your spine that numbs your body below the waist (spinal anesthesia). Most often, several  small cuts (incisions) are made in the knee. The arthroscope and instruments go into the incisions to repair the damage. The torn portion of the meniscus is removed.   AFTER THE PROCEDURE You will be taken to the recovery area where your progress will be monitored. When you are awake, stable, and taking fluids without complications, you will be allowed to go home. This is usually the same day. A torn or stretched ligament (ligament sprain) may take 6-8 weeks to heal.  It takes about the 4-6 WEEKS if your surgeon removed a torn meniscus. A repaired meniscus may require 6-12 weeks of recovery time. A torn ligament needing reconstructive surgery may take 6-12 months to heal fully.   This information is not intended to replace advice given to you by your health care provider. Make sure you discuss any questions you have with your health care provider. You have decided to proceed with operative arthroscopy of the knee. You have decided not to continue with nonoperative measures such as but not limited to oral medication, weight loss, activity modification, physical therapy, bracing, or injection.  We will perform operative arthroscopy of the knee. Some of the risks associated with arthroscopic surgery of the knee include but are not limited to Bleeding Infection Swelling Stiffness Blood clot Pain Need for knee replacement surgery    In compliance with recent [place] law in federal regulation regarding opioid use and abuse and addiction, we will taper (stop) opioid medication after 2 weeks.  If you're not comfortable with these risks and would like to continue with nonoperative treatment please let Dr. Romeo Apple know prior to your surgery.

## 2020-11-30 NOTE — H&P (View-Only) (Signed)
MRI F/U  Chief Complaint  Patient presents with   Results    MRI left knee   Encounter Diagnoses  Name Primary?   Acute medial meniscus tear of left knee, subsequent encounter Yes   Primary localized osteoarthritis of knee left knee     Ms. Demby complains of pain primarily at night she is not taking any medication at this time she does complain of some right thigh pain with a cool feeling way across the right knee and some increased right knee pain  Her left knee is slightly better in the brace  We discussed the treatment options based on the MRI findings which show torn medial meniscus arthritis all 3 compartments strain of the medial collateral ligament  After discussion with her husband present using models and showing MRI pictures they have decided to proceed with arthroscopy of the left knee partial medial meniscectomy with known risks of possibly worsening possibly needing further treatment for the arthritis  Plan arthroscopy left knee partial medial meniscectomy 

## 2020-12-03 ENCOUNTER — Ambulatory Visit: Payer: BC Managed Care – PPO | Admitting: Orthopedic Surgery

## 2020-12-08 NOTE — Patient Instructions (Signed)
Evelyn PaceDionne T Moyer  12/08/2020     @PREFPERIOPPHARMACY @   Your procedure is scheduled on  12/15/2020.   [facility] at  1130  A.M.   Call this number if you have problems the morning of surgery:  [phone]   Remember:  Do not eat or drink after midnight.      Take these medicines the morning of surgery with A SIP OF WATER           Flexeril (if needed), hydrocodone (if needed).     Do not wear jewelry, make-up or nail polish.  Do not wear lotions, powders, or perfumes, or deodorant.  Do not shave 48 hours prior to surgery.  Men may shave face and neck.  Do not bring valuables to the hospital.  [facility] not [facility] for any belongings or valuables.  Contacts, dentures or bridgework may not be worn into surgery.  Leave your suitcase in the car.  After surgery it may be brought to your room.  For patients admitted to the hospital, discharge time will be determined by your treatment team.  Patients discharged the day of surgery will not be allowed to drive home and must have someone with them for 24 hours.    Special instructions:   DO NOT smoke tobacco or vape for 24 hours before your procedure.  Please read over the following fact sheets that you were given. Coughing and Deep Breathing, Surgical Site Infection Prevention, Anesthesia Post-op Instructions, and Care and Recovery After Surgery      Arthroscopic Knee Ligament Repair, Care After This sheet gives you information about how to care for yourself after your procedure. Your health care provider may also give you more specific instructions. If you have problems or questions, contact your health careprovider. What can I expect after the procedure? After the procedure, it is common to have: Soreness or pain in your knee. Bruising and swelling on your knee, calf, and ankle for 3-4 days. A small amount of fluid coming from the incisions. Follow these instructions at home: Medicines Take  over-the-counter and prescription medicines only as told by your health care provider. Ask your health care provider if the medicine prescribed to you: Requires you to avoid driving or using machinery. Can cause constipation. You may need to take these actions to prevent or treat constipation: Drink enough fluid to keep your urine pale yellow. Take over-the-counter or prescription medicines. Eat foods that are high in fiber, such as beans, whole grains, and fresh fruits and vegetables. Limit foods that are high in fat and processed sugars, such as fried or sweet foods. If you have a brace or immobilizer: Wear it as told by your health care provider. Remove it only as told by your health care provider. Loosen it if your toes tingle, become numb, or turn cold and blue. Keep it clean and dry. Ask your health care provider when it is safe to drive. Bathing Do not take baths, swim, or use a hot tub until your health care provider approves. Keep your bandage (dressing) dry until your health care provider says that it can be removed. If the brace or immobilizer is not waterproof: Do not let it get wet. Cover it with a watertight covering when you take a bath or shower. Incision care  Follow instructions from your health care provider about how to take care of your incisions. Make sure you: Wash your hands with soap and  water for at least 20 seconds before and after you change your dressing. If soap and water are not available, use hand sanitizer. Change your dressing as told by your health care provider. Leave stitches (sutures), skin glue, or adhesive strips in place. These skin closures may need to stay in place for 2 weeks or longer. If adhesive strip edges start to loosen and curl up, you may trim the loose edges. Do not remove adhesive strips completely unless your health care provider tells you to do that. Check your incision areas every day for signs of infection. Check for: Redness. More  swelling or pain. Blood or more fluid. Warmth. Pus or a bad smell.  Managing pain, stiffness, and swelling  If directed, put ice on the affected area. To do this: If you have a removable brace or immobilizer, remove it as told by your health care provider. Put ice in a plastic bag. Place a towel between your skin and the bag. Leave the ice on for 20 minutes, 2-3 times a day. Remove the ice if your skin turns bright red. This is very important. If you cannot feel pain, heat, or cold, you have a greater risk of damage to the area. Move your toes often to reduce stiffness and swelling. Raise (elevate) the injured area above the level of your heart while you are sitting or lying down.  Activity Do not use your knee to support your body weight until your health care provider says that you can. Use crutches or other devices as told by your health care provider. Do physical therapy exercises as told by your health care provider. Physical therapy will help you regain movement and strength in your knee. Follow instructions from your health care provider about: When you may start motion exercises. When you may start riding a stationary bike and doing other low-impact activities. When you may start to jog and do other high-impact activities. Do not lift anything that is heavier than 10 lb (4.5 kg), or the limit that you are told, until your health care provider says that it is safe. Ask your health care provider what activities are safe for you. General instructions Do not use any products that contain nicotine or tobacco, such as cigarettes, e-cigarettes, and chewing tobacco. These can delay healing. If you need help quitting, ask your health care provider. Wear compression stockings as told by your health care provider. These stockings help to prevent blood clots and reduce swelling in your legs. Keep all follow-up visits. This is important. Contact a health care provider if: You have any of  these signs of infection: Redness around an incision. Blood or more fluid coming from an incision. Warmth coming from an incision. Pus or a bad smell coming from an incision. More swelling or pain in your knee. A fever or chills. You have pain that does not get better with medicine. Your incision opens up. Get help right away if: You have trouble breathing. You have chest pain. You have increased pain or swelling in your calf or at the back of your knee. You have numbness and tingling near the knee joint or in the foot, ankle, or toes. You notice that your foot or toes look darker than normal or are cooler than normal. These symptoms may represent a serious problem that is an emergency. Do not wait to see if the symptoms will go away. Get medical help right away. Call your local emergency services (911 in the [place] Do not drive yourself  to the hospital. Summary After the procedure, it is common to have knee pain with bruising and swelling on your knee, calf, and ankle. Icing your knee and raising your leg above the level of your heart will help control the pain and swelling. Do physical therapy exercises as told by your health care provider. Physical therapy will help you regain movement and strength in your knee. This information is not intended to replace advice given to you by your health care provider. Make sure you discuss any questions you have with your healthcare provider. Document Revised: 08/26/2019 Document Reviewed: 08/26/2019 Elsevier Patient Education  2022 [employer] General Anesthesia, Adult, Care After This sheet gives you information about how to care for yourself after your procedure. Your health care provider may also give you more specific instructions. If you have problems or questions, contact your health careprovider. What can I expect after the procedure? After the procedure, the following side effects are common: Pain or discomfort at the IV  site. Nausea. Vomiting. Sore throat. Trouble concentrating. Feeling cold or chills. Feeling weak or tired. Sleepiness and fatigue. Soreness and body aches. These side effects can affect parts of the body that were not involved in surgery. Follow these instructions at home: For the time period you were told by your health care provider:  Rest. Do not participate in activities where you could fall or become injured. Do not drive or use machinery. Do not drink alcohol. Do not take sleeping pills or medicines that cause drowsiness. Do not make important decisions or sign legal documents. Do not take care of children on your own.  Eating and drinking Follow any instructions from your health care provider about eating or drinking restrictions. When you feel hungry, start by eating small amounts of foods that are soft and easy to digest (bland), such as toast. Gradually return to your regular diet. Drink enough fluid to keep your urine pale yellow. If you vomit, rehydrate by drinking water, juice, or clear broth. General instructions If you have sleep apnea, surgery and certain medicines can increase your risk for breathing problems. Follow instructions from your health care provider about wearing your sleep device: Anytime you are sleeping, including during daytime naps. While taking prescription pain medicines, sleeping medicines, or medicines that make you drowsy. Have a responsible adult stay with you for the time you are told. It is important to have someone help care for you until you are awake and alert. Return to your normal activities as told by your health care provider. Ask your health care provider what activities are safe for you. Take over-the-counter and prescription medicines only as told by your health care provider. If you smoke, do not smoke without supervision. Keep all follow-up visits as told by your health care provider. This is important. Contact a health care  provider if: You have nausea or vomiting that does not get better with medicine. You cannot eat or drink without vomiting. You have pain that does not get better with medicine. You are unable to pass urine. You develop a skin rash. You have a fever. You have redness around your IV site that gets worse. Get help right away if: You have difficulty breathing. You have chest pain. You have blood in your urine or stool, or you vomit blood. Summary After the procedure, it is common to have a sore throat or nausea. It is also common to feel tired. Have a responsible adult stay with you for the time you are told.  It is important to have someone help care for you until you are awake and alert. When you feel hungry, start by eating small amounts of foods that are soft and easy to digest (bland), such as toast. Gradually return to your regular diet. Drink enough fluid to keep your urine pale yellow. Return to your normal activities as told by your health care provider. Ask your health care provider what activities are safe for you. This information is not intended to replace advice given to you by your health care provider. Make sure you discuss any questions you have with your healthcare provider. Document Revised: 12/12/2019 Document Reviewed: 07/11/2019 Elsevier Patient Education  2022 [employer] Inc. How to Use Chlorhexidine for Bathing Chlorhexidine gluconate (CHG) is a germ-killing (antiseptic) solution that is used to clean the skin. It can get rid of the bacteria that normally live on the skin and can keep them away for about 24 hours. To clean your skin with CHG, you may be given: A CHG solution to use in the shower or as part of a sponge bath. A prepackaged cloth that contains CHG. Cleaning your skin with CHG may help lower the risk for infection: While you are staying in the intensive care unit of the hospital. If you have a vascular access, such as a central line, to provide short-term or  long-term access to your veins. If you have a catheter to drain urine from your bladder. If you are on a ventilator. A ventilator is a machine that helps you breathe by moving air in and out of your lungs. After surgery. What are the risks? Risks of using CHG include: A skin reaction. Hearing loss, if CHG gets in your ears. Eye injury, if CHG gets in your eyes and is not rinsed out. The CHG product catching fire. Make sure that you avoid smoking and flames after applying CHG to your skin. Do not use CHG: If you have a chlorhexidine allergy or have previously reacted to chlorhexidine. On babies younger than 74 months of age. How to use CHG solution Use CHG only as told by your health care provider, and follow the instructions on the label. Use the full amount of CHG as directed. Usually, this is one bottle. During a shower Follow these steps when using CHG solution during a shower (unless your health care provider gives you different instructions): Start the shower. Use your normal soap and shampoo to wash your face and hair. Turn off the shower or move out of the shower stream. Pour the CHG onto a clean washcloth. Do not use any type of brush or rough-edged sponge. Starting at your neck, lather your body down to your toes. Make sure you follow these instructions: If you will be having surgery, pay special attention to the part of your body where you will be having surgery. Scrub this area for at least 1 minute. Do not use CHG on your head or face. If the solution gets into your ears or eyes, rinse them well with water. Avoid your genital area. Avoid any areas of skin that have broken skin, cuts, or scrapes. Scrub your back and under your arms. Make sure to wash skin folds. Let the lather sit on your skin for 1-2 minutes or as long as told by your health care provider. Thoroughly rinse your entire body in the shower. Make sure that all body creases and crevices are rinsed well. Dry off  with a clean towel. Do not put any substances on your  body afterward--such as powder, lotion, or perfume--unless you are told to do so by your health care provider. Only use lotions that are recommended by the manufacturer. Put on clean clothes or pajamas. If it is the night before your surgery, sleep in clean sheets.  During a sponge bath Follow these steps when using CHG solution during a sponge bath (unless your health care provider gives you different instructions): Use your normal soap and shampoo to wash your face and hair. Pour the CHG onto a clean washcloth. Starting at your neck, lather your body down to your toes. Make sure you follow these instructions: If you will be having surgery, pay special attention to the part of your body where you will be having surgery. Scrub this area for at least 1 minute. Do not use CHG on your head or face. If the solution gets into your ears or eyes, rinse them well with water. Avoid your genital area. Avoid any areas of skin that have broken skin, cuts, or scrapes. Scrub your back and under your arms. Make sure to wash skin folds. Let the lather sit on your skin for 1-2 minutes or as long as told by your health care provider. Using a different clean, wet washcloth, thoroughly rinse your entire body. Make sure that all body creases and crevices are rinsed well. Dry off with a clean towel. Do not put any substances on your body afterward--such as powder, lotion, or perfume--unless you are told to do so by your health care provider. Only use lotions that are recommended by the manufacturer. Put on clean clothes or pajamas. If it is the night before your surgery, sleep in clean sheets. How to use CHG prepackaged cloths Only use CHG cloths as told by your health care provider, and follow the instructions on the label. Use the CHG cloth on clean, dry skin. Do not use the CHG cloth on your head or face unless your health care provider tells you to. When  washing with the CHG cloth: Avoid your genital area. Avoid any areas of skin that have broken skin, cuts, or scrapes. Before surgery Follow these steps when using a CHG cloth to clean before surgery (unless your health care provider gives you different instructions): Using the CHG cloth, vigorously scrub the part of your body where you will be having surgery. Scrub using a back-and-forth motion for 3 minutes. The area on your body should be completely wet with CHG when you are done scrubbing. Do not rinse. Discard the cloth and let the area air-dry. Do not put any substances on the area afterward, such as powder, lotion, or perfume. Put on clean clothes or pajamas. If it is the night before your surgery, sleep in clean sheets.  For general bathing Follow these steps when using CHG cloths for general bathing (unless your health care provider gives you different instructions). Use a separate CHG cloth for each area of your body. Make sure you wash between any folds of skin and between your fingers and toes. Wash your body in the following order, switching to a new cloth after each step: The front of your neck, shoulders, and chest. Both of your arms, under your arms, and your hands. Your stomach and groin area, avoiding the genitals. Your right leg and foot. Your left leg and foot. The back of your neck, your back, and your buttocks. Do not rinse. Discard the cloth and let the area air-dry. Do not put any substances on your body afterward--such as  powder, lotion, or perfume--unless you are told to do so by your health care provider. Only use lotions that are recommended by the manufacturer. Put on clean clothes or pajamas. Contact a health care provider if: Your skin gets irritated after scrubbing. You have questions about using your solution or cloth. Get help right away if: Your eyes become very red or swollen. Your eyes itch badly. Your skin itches badly and is red or swollen. Your  hearing changes. You have trouble seeing. You have swelling or tingling in your mouth or throat. You have trouble breathing. You swallow any chlorhexidine. Summary Chlorhexidine gluconate (CHG) is a germ-killing (antiseptic) solution that is used to clean the skin. Cleaning your skin with CHG may help to lower your risk for infection. You may be given CHG to use for bathing. It may be in a bottle or in a prepackaged cloth to use on your skin. Carefully follow your health care provider's instructions and the instructions on the product label. Do not use CHG if you have a chlorhexidine allergy. Contact your health care provider if your skin gets irritated after scrubbing. This information is not intended to replace advice given to you by your health care provider. Make sure you discuss any questions you have with your healthcare provider. Document Revised: 08/09/2019 Document Reviewed: 09/13/2019 Elsevier Patient Education  2022 [employer].

## 2020-12-11 ENCOUNTER — Other Ambulatory Visit: Payer: Self-pay

## 2020-12-11 ENCOUNTER — Encounter (HOSPITAL_COMMUNITY): Payer: Self-pay

## 2020-12-11 ENCOUNTER — Encounter (HOSPITAL_COMMUNITY)
Admission: RE | Admit: 2020-12-11 | Discharge: 2020-12-11 | Disposition: A | Discharge status: Home / Self Care | Payer: BC Managed Care – PPO | Source: Ambulatory Visit | Attending: Orthopedic Surgery | Admitting: Orthopedic Surgery

## 2020-12-11 DIAGNOSIS — Z01812 Encounter for preprocedural laboratory examination: Secondary | ICD-10-CM | POA: Insufficient documentation

## 2020-12-11 HISTORY — DX: Panic disorder (episodic paroxysmal anxiety): F41.0

## 2020-12-11 LAB — CBC WITH DIFFERENTIAL/PLATELET
Abs Immature Granulocytes: 0 10*3/uL (ref 0.00–0.07)
Basophils Absolute: 0.1 10*3/uL (ref 0.0–0.1)
Basophils Relative: 1 %
Eosinophils Absolute: 0.1 10*3/uL (ref 0.0–0.5)
Eosinophils Relative: 2 %
HCT: 39.8 % (ref 36.0–46.0)
Hemoglobin: 13.2 g/dL (ref 12.0–15.0)
Immature Granulocytes: 0 %
Lymphocytes Relative: 43 %
Lymphs Abs: 1.8 10*3/uL (ref 0.7–4.0)
MCH: 31.6 pg (ref 26.0–34.0)
MCHC: 33.2 g/dL (ref 30.0–36.0)
MCV: 95.2 fL (ref 80.0–100.0)
Monocytes Absolute: 0.2 10*3/uL (ref 0.1–1.0)
Monocytes Relative: 6 %
Neutro Abs: 2.1 10*3/uL (ref 1.7–7.7)
Neutrophils Relative %: 48 %
Platelets: 261 10*3/uL (ref 150–400)
RBC: 4.18 MIL/uL (ref 3.87–5.11)
RDW: 11.5 % (ref 11.5–15.5)
WBC: 4.2 10*3/uL (ref 4.0–10.5)
nRBC: 0 % (ref 0.0–0.2)

## 2020-12-11 LAB — BASIC METABOLIC PANEL
Anion gap: 7 (ref 5–15)
BUN: 17 mg/dL (ref 6–20)
CO2: 28 mmol/L (ref 22–32)
Calcium: 9.6 mg/dL (ref 8.9–10.3)
Chloride: 103 mmol/L (ref 98–111)
Creatinine, Ser: 0.69 mg/dL (ref 0.44–1.00)
GFR, Estimated: >60 mL/min (ref >60)
Glucose, Bld: 115 mg/dL — ABNORMAL HIGH (ref 70–99)
Potassium: 3.6 mmol/L (ref 3.5–5.1)
Sodium: 138 mmol/L (ref 135–145)

## 2020-12-11 LAB — HCG, SERUM, QUALITATIVE: Preg, Serum: NEGATIVE

## 2020-12-15 ENCOUNTER — Ambulatory Visit (HOSPITAL_COMMUNITY): Payer: BC Managed Care – PPO | Admitting: Anesthesiology

## 2020-12-15 ENCOUNTER — Encounter (HOSPITAL_COMMUNITY): Payer: Self-pay | Admitting: Orthopedic Surgery

## 2020-12-15 ENCOUNTER — Other Ambulatory Visit: Payer: Self-pay

## 2020-12-15 ENCOUNTER — Ambulatory Visit (HOSPITAL_COMMUNITY)
Admission: RE | Admit: 2020-12-15 | Discharge: 2020-12-15 | Disposition: A | Discharge status: Home / Self Care | Payer: BC Managed Care – PPO | Attending: Orthopedic Surgery | Admitting: Orthopedic Surgery

## 2020-12-15 ENCOUNTER — Other Ambulatory Visit: Payer: Self-pay | Admitting: Orthopedic Surgery

## 2020-12-15 ENCOUNTER — Encounter (HOSPITAL_COMMUNITY)
Admission: RE | Disposition: A | Discharge status: Home / Self Care | Payer: Self-pay | Source: Home / Self Care | Attending: Orthopedic Surgery

## 2020-12-15 DIAGNOSIS — M1712 Unilateral primary osteoarthritis, left knee: Secondary | ICD-10-CM | POA: Diagnosis not present

## 2020-12-15 DIAGNOSIS — S83242A Other tear of medial meniscus, current injury, left knee, initial encounter: Secondary | ICD-10-CM | POA: Diagnosis not present

## 2020-12-15 DIAGNOSIS — M23322 Other meniscus derangements, posterior horn of medial meniscus, left knee: Secondary | ICD-10-CM | POA: Diagnosis not present

## 2020-12-15 DIAGNOSIS — Y9317 Activity, water skiing and wake boarding: Secondary | ICD-10-CM | POA: Diagnosis not present

## 2020-12-15 HISTORY — PX: KNEE ARTHROSCOPY WITH MEDIAL MENISECTOMY: SHX5651

## 2020-12-15 SURGERY — ARTHROSCOPY, KNEE, WITH MEDIAL MENISCECTOMY
Anesthesia: General | Site: Knee | Laterality: Left

## 2020-12-15 MED ORDER — CHLORHEXIDINE GLUCONATE 0.12 % MT SOLN
15.0000 mL | Freq: Once | OROMUCOSAL | Status: AC
  Administered 2020-12-15: 15 mL via OROMUCOSAL
  Filled 2020-12-15: qty 15

## 2020-12-15 MED ORDER — PROPOFOL 10 MG/ML IV BOLUS
INTRAVENOUS | Status: DC | PRN
  Administered 2020-12-15: 200 mg via INTRAVENOUS

## 2020-12-15 MED ORDER — BUPIVACAINE-EPINEPHRINE (PF) 0.5% -1:200000 IJ SOLN
INTRAMUSCULAR | Status: DC | PRN
  Administered 2020-12-15: 30 mL

## 2020-12-15 MED ORDER — LACTATED RINGERS IV SOLN: INTRAVENOUS | Status: DC

## 2020-12-15 MED ORDER — EPHEDRINE SULFATE 50 MG/ML IJ SOLN
INTRAMUSCULAR | Status: DC | PRN
  Administered 2020-12-15 (×4): 5 mg via INTRAVENOUS

## 2020-12-15 MED ORDER — HYDROCODONE-ACETAMINOPHEN 5-325 MG PO TABS
1.0000 | ORAL_TABLET | Freq: Once | ORAL | Status: AC
  Administered 2020-12-15: 1 via ORAL
  Filled 2020-12-15: qty 1

## 2020-12-15 MED ORDER — PROMETHAZINE HCL 12.5 MG PO TABS: 12.5000 mg | ORAL_TABLET | Freq: Four times a day (QID) | ORAL | 0 refills | Status: DC | PRN

## 2020-12-15 MED ORDER — BUPIVACAINE-EPINEPHRINE (PF) 0.5% -1:200000 IJ SOLN
INTRAMUSCULAR | Status: AC
  Filled 2020-12-15: qty 30

## 2020-12-15 MED ORDER — ONDANSETRON HCL 4 MG/2ML IJ SOLN: 4.0000 mg | Freq: Once | INTRAMUSCULAR | Status: DC | PRN

## 2020-12-15 MED ORDER — SODIUM CHLORIDE 0.9 % IR SOLN
Status: DC | PRN
  Administered 2020-12-15: 2 mL

## 2020-12-15 MED ORDER — FENTANYL CITRATE (PF) 100 MCG/2ML IJ SOLN
INTRAMUSCULAR | Status: AC
  Filled 2020-12-15: qty 2

## 2020-12-15 MED ORDER — ORAL CARE MOUTH RINSE: 15.0000 mL | Freq: Once | OROMUCOSAL | Status: AC

## 2020-12-15 MED ORDER — ONDANSETRON HCL 4 MG/2ML IJ SOLN
4.0000 mg | Freq: Once | INTRAMUSCULAR | Status: AC
  Administered 2020-12-15: 4 mg via INTRAVENOUS
  Filled 2020-12-15: qty 2

## 2020-12-15 MED ORDER — LIDOCAINE HCL (CARDIAC) PF 50 MG/5ML IV SOSY
PREFILLED_SYRINGE | INTRAVENOUS | Status: DC | PRN
  Administered 2020-12-15: 40 mg via INTRAVENOUS

## 2020-12-15 MED ORDER — ONDANSETRON HCL 4 MG/2ML IJ SOLN
INTRAMUSCULAR | Status: DC | PRN
  Administered 2020-12-15: 4 mg via INTRAVENOUS

## 2020-12-15 MED ORDER — CEFAZOLIN SODIUM-DEXTROSE 2-4 GM/100ML-% IV SOLN
2.0000 g | INTRAVENOUS | Status: AC
Start: 2020-12-15 — End: 2020-12-15
  Administered 2020-12-15: 2 g via INTRAVENOUS
  Filled 2020-12-15: qty 100

## 2020-12-15 MED ORDER — FENTANYL CITRATE (PF) 100 MCG/2ML IJ SOLN
INTRAMUSCULAR | Status: DC | PRN
  Administered 2020-12-15 (×2): 50 ug via INTRAVENOUS

## 2020-12-15 MED ORDER — EPINEPHRINE PF 1 MG/ML IJ SOLN
INTRAMUSCULAR | Status: AC
  Filled 2020-12-15: qty 4

## 2020-12-15 MED ORDER — DEXAMETHASONE SODIUM PHOSPHATE 10 MG/ML IJ SOLN
INTRAMUSCULAR | Status: DC | PRN
  Administered 2020-12-15: 5 mg via INTRAVENOUS

## 2020-12-15 MED ORDER — HYDROMORPHONE HCL 1 MG/ML IJ SOLN: 0.2500 mg | INTRAMUSCULAR | Status: DC | PRN

## 2020-12-15 MED ORDER — HYDROCODONE-ACETAMINOPHEN 5-325 MG PO TABS: 1.0000 | ORAL_TABLET | ORAL | 0 refills | Status: DC | PRN

## 2020-12-15 MED ORDER — IBUPROFEN 800 MG PO TABS
800.0000 mg | ORAL_TABLET | Freq: Once | ORAL | Status: AC
  Administered 2020-12-15: 800 mg via ORAL
  Filled 2020-12-15: qty 1

## 2020-12-15 SURGICAL SUPPLY — 57 items
ABLATOR ASPIRATE 50D MULTI-PRT (SURGICAL WAND) IMPLANT
APL PRP STRL LF DISP 70% ISPRP (MISCELLANEOUS) ×1
BAG HAMPER (MISCELLANEOUS) ×2 IMPLANT
BANDAGE ELASTIC 6 VELCRO ST LF (GAUZE/BANDAGES/DRESSINGS) ×1 IMPLANT
BLADE SHAVER TORPEDO 4X13 (MISCELLANEOUS) ×1 IMPLANT
BLADE SURG SZ11 CARB STEEL (BLADE) ×2 IMPLANT
BNDG CMPR STD VLCR NS LF 5.8X6 (GAUZE/BANDAGES/DRESSINGS) ×1
BNDG ELASTIC 6X5.8 VLCR NS LF (GAUZE/BANDAGES/DRESSINGS) ×2 IMPLANT
CHLORAPREP W/TINT 26 (MISCELLANEOUS) ×2 IMPLANT
CLOTH BEACON ORANGE TIMEOUT ST (SAFETY) ×2 IMPLANT
COOLER ICEMAN CLASSIC (MISCELLANEOUS) ×2 IMPLANT
CUFF TOURN SGL QUICK 34 (TOURNIQUET CUFF)
CUFF TRNQT CYL 34X4.125X (TOURNIQUET CUFF) IMPLANT
DECANTER SPIKE VIAL GLASS SM (MISCELLANEOUS) ×4 IMPLANT
DISSECTOR 4.0MM X 13CM (MISCELLANEOUS) ×1 IMPLANT
DRAPE HALF SHEET 40X57 (DRAPES) ×2 IMPLANT
DRSG XEROFORM 1X8 (GAUZE/BANDAGES/DRESSINGS) ×1 IMPLANT
GAUZE 4X4 16PLY ~~LOC~~+RFID DBL (SPONGE) ×2 IMPLANT
GAUZE SPONGE 4X4 12PLY STRL (GAUZE/BANDAGES/DRESSINGS) ×2 IMPLANT
GAUZE SPONGE 4X4 16PLY XRAY LF (GAUZE/BANDAGES/DRESSINGS) ×2 IMPLANT
GAUZE XEROFORM 5X9 LF (GAUZE/BANDAGES/DRESSINGS) ×2 IMPLANT
GLOVE SS N UNI LF 8.5 STRL (GLOVE) ×2 IMPLANT
GLOVE SURG POLYISO LF SZ8 (GLOVE) ×2 IMPLANT
GLOVE SURG UNDER POLY LF SZ7 (GLOVE) ×4 IMPLANT
GOWN STRL REUS W/TWL LRG LVL3 (GOWN DISPOSABLE) ×2 IMPLANT
GOWN STRL REUS W/TWL XL LVL3 (GOWN DISPOSABLE) ×2 IMPLANT
IV NS IRRIG 3000ML ARTHROMATIC (IV SOLUTION) ×4 IMPLANT
KIT BLADEGUARD II DBL (SET/KITS/TRAYS/PACK) ×2 IMPLANT
KIT TURNOVER CYSTO (KITS) ×2 IMPLANT
MANIFOLD NEPTUNE II (INSTRUMENTS) ×2 IMPLANT
MARKER SKIN DUAL TIP RULER LAB (MISCELLANEOUS) ×2 IMPLANT
NDL HYPO 18GX1.5 BLUNT FILL (NEEDLE) ×1 IMPLANT
NDL HYPO 21X1.5 SAFETY (NEEDLE) ×1 IMPLANT
NDL SPNL 18GX3.5 QUINCKE PK (NEEDLE) ×1 IMPLANT
NEEDLE HYPO 18GX1.5 BLUNT FILL (NEEDLE) ×2 IMPLANT
NEEDLE HYPO 21X1.5 SAFETY (NEEDLE) ×2 IMPLANT
NEEDLE SPNL 18GX3.5 QUINCKE PK (NEEDLE) ×2 IMPLANT
NS IRRIG 1000ML POUR BTL (IV SOLUTION) ×2 IMPLANT
PACK ARTHRO LIMB DRAPE STRL (MISCELLANEOUS) ×2 IMPLANT
PAD ABD 5X9 TENDERSORB (GAUZE/BANDAGES/DRESSINGS) ×2 IMPLANT
PAD ABD 8X10 STRL (GAUZE/BANDAGES/DRESSINGS) ×1 IMPLANT
PAD ARMBOARD 7.5X6 YLW CONV (MISCELLANEOUS) ×2 IMPLANT
PAD COLD SHLDR SM WRAP-ON (PAD) ×1 IMPLANT
PAD COLD SHLDR WRAP-ON (PAD)
PAD FOR LEG HOLDER (MISCELLANEOUS) ×2 IMPLANT
PADDING CAST COTTON 6X4 STRL (CAST SUPPLIES) ×2 IMPLANT
PADDING WEBRIL 6 STERILE (GAUZE/BANDAGES/DRESSINGS) ×1 IMPLANT
PORT APPOLLO RF 90DEGREE MULTI (SURGICAL WAND) IMPLANT
SET ARTHROSCOPY INST (INSTRUMENTS) ×2 IMPLANT
SET BASIN LINEN APH (SET/KITS/TRAYS/PACK) ×2 IMPLANT
SPONGE GAUZE 4X4 STERILE 39 (GAUZE/BANDAGES/DRESSINGS) ×1 IMPLANT
SUT ETHILON 3 0 FSL (SUTURE) ×2 IMPLANT
SYR 10ML LL (SYRINGE) ×2 IMPLANT
SYR 30ML LL (SYRINGE) ×2 IMPLANT
TUBE CONNECTING 12X1/4 (SUCTIONS) ×4 IMPLANT
TUBING IN/OUT FLOW W/MAIN PUMP (TUBING) ×2 IMPLANT
WAND APOLLO RF 50D ABLATOR (BUR) ×1 IMPLANT

## 2020-12-15 NOTE — Transfer of Care (Signed)
Immediate Anesthesia Transfer of Care Note  Patient: Evelyn Moyer  Procedure(s) Performed: KNEE ARTHROSCOPY WITH MEDIAL MENISECTOMY (Left: Knee)  Patient Location: PACU  Anesthesia Type:General  Level of Consciousness: awake and patient cooperative  Airway & Oxygen Therapy: Patient Spontanous Breathing  Post-op Assessment: Report given to RN and Post -op Vital signs reviewed and stable  Post vital signs: Reviewed and stable  Last Vitals:  Vitals Value Taken Time  BP 144/72 12/15/20 1230  Temp 97.8   Pulse 93 12/15/20 1232  Resp 10 12/15/20 1232  SpO2 100 % 12/15/20 1232  Vitals shown include unvalidated device data.  Last Pain:  Vitals:   12/15/20 0927  TempSrc: Oral  PainSc: 1       Patients Stated Pain Goal: 5 (12/15/20 0927)  Complications: No notable events documented.

## 2020-12-15 NOTE — Brief Op Note (Signed)
12/15/2020  12:42 PM  PATIENT:  Evelyn Moyer  55 y.o. female  PRE-OPERATIVE DIAGNOSIS:  left knee torn medial meniscus  POST-OPERATIVE DIAGNOSIS:  left knee torn medial meniscus  PROCEDURE:  Procedure(s): KNEE ARTHROSCOPY WITH MEDIAL MENISECTOMY (Left) Chondroplasty lateral femoral condyle Debridement posterior horn lateral meniscus   SURGEON:  Surgeon(s) and Role:    Vickki Hearing, MD - Primary  PHYSICIAN ASSISTANT:   ASSISTANTS: none   ANESTHESIA:   general  EBL:  10 mL   BLOOD ADMINISTERED:none  DRAINS: none   LOCAL MEDICATIONS USED:  MARCAINE     SPECIMEN:  No Specimen  DISPOSITION OF SPECIMEN:  N/A  COUNTS:  YES  TOURNIQUET:  * Missing tourniquet times found for documented tourniquets in log: [id number] *  DICTATION: .Dragon Dictation  PLAN OF CARE: Discharge to home after PACU  PATIENT DISPOSITION:  PACU - hemodynamically stable.   Delay start of Pharmacological VTE agent (>24hrs) due to surgical blood loss or risk of bleeding: not applicable

## 2020-12-15 NOTE — Interval H&P Note (Signed)
History and Physical Interval Note:  12/15/2020 10:46 AM  Evelyn Moyer  has presented today for surgery, with the diagnosis of left knee torn medial meniscus.  The various methods of treatment have been discussed with the patient and family. After consideration of risks, benefits and other options for treatment, the patient has consented to  Procedure(s): KNEE ARTHROSCOPY WITH MEDIAL MENISECTOMY (Left) as a surgical intervention.  The patient's history has been reviewed, patient examined, no change in status, stable for surgery.  I have reviewed the patient's chart and labs.  Questions were answered to the patient's satisfaction.     Fuller Canada

## 2020-12-15 NOTE — Anesthesia Preprocedure Evaluation (Signed)
Anesthesia Evaluation  Patient identified by MRN, date of birth, ID band Patient awake    Reviewed: Allergy & Precautions, NPO status , Patient's Chart, lab work & pertinent test results  Airway Mallampati: I  TM Distance: >3 FB Neck ROM: Full    Dental  (+) Dental Advisory Given, Teeth Intact   Pulmonary neg pulmonary ROS,    Pulmonary exam normal breath sounds clear to auscultation       Cardiovascular negative cardio ROS Normal cardiovascular exam Rhythm:Regular Rate:Normal     Neuro/Psych Anxiety negative neurological ROS     GI/Hepatic negative GI ROS, Neg liver ROS,   Endo/Other  negative endocrine ROS  Renal/GU negative Renal ROS  negative genitourinary   Musculoskeletal negative musculoskeletal ROS (+)   Abdominal   Peds negative pediatric ROS (+)  Hematology negative hematology ROS (+)   Anesthesia Other Findings   Reproductive/Obstetrics negative OB ROS                             Anesthesia Physical Anesthesia Plan  ASA: 1  Anesthesia Plan: General   Post-op Pain Management:    Induction:   PONV Risk Score and Plan:   Airway Management Planned: LMA  Additional Equipment:   Intra-op Plan:   Post-operative Plan: Extubation in OR  Informed Consent: I have reviewed the patients History and Physical, chart, labs and discussed the procedure including the risks, benefits and alternatives for the proposed anesthesia with the patient or authorized representative who has indicated his/her understanding and acceptance.     Dental advisory given  Plan Discussed with: CRNA and Surgeon  Anesthesia Plan Comments:         Anesthesia Quick Evaluation

## 2020-12-15 NOTE — Op Note (Signed)
12/15/2020  12:43 PM  Knee arthroscopy dictation  Preop diagnosis osteoarthritis and tear medial meniscus left knee  Postop diagnosis same  Procedure arthroscopy arthroscopy partial medial meniscectomy debridement lateral meniscus and chondroplasty lateral tibial plateau  Surgeon Romeo Apple  Operative findings  MEDIAL grade 3 tibial plateau grade 4 medial femoral condyle free edge tear medial meniscus  LATERAL chondral flap tibia full-thickness grade 4 posterior horn root free edge tear body free edge tear  PTF grade 4 trochlear lesion grade 3 medial patellar lesion  NOTCH normal ACL PCL with hemorrhagic lesion suggesting tibial trauma with a kissing lesion on the medial femoral condyle with hemorrhage and old clot     The patient was identified in the preoperative holding area using 2 approved identification mechanisms. The chart was reviewed and updated. The surgical site was confirmed as left knee and marked with an indelible marker.  The patient was taken to the operating room for anesthesia. After successful general anesthesia, Ancef was used as IV antibiotics.  The patient was placed in the supine position with the (left) the operative extremity in an arthroscopic leg holder and the opposite extremity in a padded leg holder.  The timeout was executed.  A lateral portal was established with an 11 blade and the scope was introduced into the joint. A diagnostic arthroscopy was performed in circumferential manner examining the entire knee joint. A medial portal was established and the diagnostic arthroscopy was repeated using a probe to palpate intra-articular structures as they were encountered.    The medial meniscus was resected using a duckbill forceps. The meniscal fragments were removed with a motorized shaver. The meniscus was balanced with a combination of a motorized shaver and a 50 ArthroCare wand until a stable rim was obtained.    The lateral flap was evaluated  with a probe and then removed with a shaver and a 50 degree ArthroCare wand until a stable rim was reached the posterior horn was also treated with the 50 degree wand and the free edge tear as well  The arthroscopic pump was placed on the wash mode and any excess debris was removed from the joint using suction.  60 cc of Marcaine with epinephrine was injected through the arthroscope.  The portals were closed with 3-0 nylon suture.  A sterile bandage, Ace wrap and Cryo/Cuff was placed and the Cryo/Cuff was activated. The patient was taken to the recovery room in stable condition.  PHYSICIAN ASSISTANT: no  ASSISTANTS: none   ANESTHESIA:   General  EBL:  none   BLOOD ADMINISTERED:none  DRAINS: none   LOCAL MEDICATIONS USED:  MARCAINE     SPECIMEN:  No Specimen  DISPOSITION OF SPECIMEN:  N/A  COUNTS:  YES   DICTATION: .Dragon Dictation  PLAN OF CARE: Discharge to home after PACU  PATIENT DISPOSITION:  PACU - hemodynamically stable.   Delay start of Pharmacological VTE agent (>24hrs) due to surgical blood loss or risk of bleeding: not applicable

## 2020-12-15 NOTE — Anesthesia Procedure Notes (Signed)
Procedure Name: LMA Insertion Date/Time: 12/15/2020 11:19 AM Performed by: Franco Nones, CRNA Pre-anesthesia Checklist: Patient identified, Patient being monitored, Emergency Drugs available, Timeout performed and Suction available Patient Re-evaluated:Patient Re-evaluated prior to induction Oxygen Delivery Method: Circle System Utilized Preoxygenation: Pre-oxygenation with 100% oxygen Induction Type: IV induction Ventilation: Mask ventilation without difficulty LMA: LMA inserted LMA Size: 4.0 Tube size: 3.0 mm Number of attempts: 1 Placement Confirmation: positive ETCO2 and breath sounds checked- equal and bilateral Tube secured with: Tape Dental Injury: Teeth and Oropharynx as per pre-operative assessment

## 2020-12-15 NOTE — Anesthesia Postprocedure Evaluation (Signed)
Anesthesia Post Note  Patient: Evelyn Moyer  Procedure(s) Performed: KNEE ARTHROSCOPY WITH MEDIAL MENISECTOMY (Left: Knee)  Patient location during evaluation: PACU Anesthesia Type: General Level of consciousness: awake and alert and oriented Pain management: pain level controlled Vital Signs Assessment: post-procedure vital signs reviewed and stable Respiratory status: spontaneous breathing and respiratory function stable Cardiovascular status: blood pressure returned to baseline and stable Postop Assessment: no apparent nausea or vomiting Anesthetic complications: no   No notable events documented.   Last Vitals:  Vitals:   12/15/20 1330 12/15/20 1338  BP: (!) 144/66 (!) (P) 152/59  Pulse: 62 (!) (P) 59  Resp: 15 (P) 14  Temp: 36.7 C (!) (P) 36.1 C  SpO2: 100% (P) 99%    Last Pain:  Vitals:   12/15/20 1338  TempSrc: (P) Axillary  PainSc: (P) 0-No pain                 Eathon Valade C Lathon Adan

## 2020-12-16 ENCOUNTER — Encounter (HOSPITAL_COMMUNITY): Payer: Self-pay | Admitting: Orthopedic Surgery

## 2020-12-17 ENCOUNTER — Encounter (HOSPITAL_COMMUNITY): Payer: Self-pay | Admitting: Orthopedic Surgery

## 2020-12-17 MED ORDER — SODIUM CHLORIDE 0.9 % IR SOLN
Status: AC | PRN
  Administered 2020-12-15: 6000 mL

## 2020-12-22 ENCOUNTER — Ambulatory Visit (INDEPENDENT_AMBULATORY_CARE_PROVIDER_SITE_OTHER): Payer: BC Managed Care – PPO | Admitting: Orthopedic Surgery

## 2020-12-22 ENCOUNTER — Encounter: Payer: Self-pay | Admitting: Orthopedic Surgery

## 2020-12-22 ENCOUNTER — Other Ambulatory Visit: Payer: Self-pay

## 2020-12-22 DIAGNOSIS — Z9889 Other specified postprocedural states: Secondary | ICD-10-CM

## 2020-12-22 NOTE — Progress Notes (Signed)
Chief Complaint  Patient presents with   Post-op Follow-up    Left knee scope 12/15/20   Preop diagnosis osteoarthritis and tear medial meniscus left knee  Postop diagnosis same  Procedure arthroscopy arthroscopy partial medial meniscectomy debridement lateral meniscus and chondroplasty lateral tibial plateau   Surgeon Romeo Apple   Operative findings   MEDIAL grade 3 tibial plateau grade 4 medial femoral condyle free edge tear medial meniscus   LATERAL chondral flap tibia full-thickness grade 4 posterior horn root free edge tear body free edge tear   PTF grade 4 trochlear lesion grade 3 medial patellar lesion   NOTCH normal ACL PCL with hemorrhagic lesion suggesting tibial trauma with a kissing lesion on the medial femoral condyle with hemorrhage and old clot    Postop day #7 status post arthroscopy of the left knee findings are listed above  Patient's pain is well controlled she is now using 1 crutch and she is in a knee brace  Portals were checked sutures were removed portals are clean knee has an effusion  She does not have full extension quite yet  Arthroscopy pictures were reviewed along with postoperative exercises  Recommend the patient continue bracing until the end of September  Wean crutches as tolerated  Return in 2 weeks check for swelling and range of motion if no significant improvement recommend formalized physical therapy  Patient is allowed to drive

## 2020-12-23 ENCOUNTER — Encounter: Payer: BC Managed Care – PPO | Admitting: Orthopedic Surgery

## 2020-12-30 ENCOUNTER — Encounter: Payer: BC Managed Care – PPO | Admitting: Orthopedic Surgery

## 2020-12-30 DIAGNOSIS — M23322 Other meniscus derangements, posterior horn of medial meniscus, left knee: Secondary | ICD-10-CM

## 2020-12-30 NOTE — H&P (Signed)
  Chief complaint pain left knee  History this is a 55 year old female who comes in with worsening left knee pain.  She has had pain for 5 to 6 years which she is dealt with.  She has remained active exercising  Recently she had increased pain after a injury coming off some jet skis.  She was seen by [facility] she was placed on Celebrex given a prescription for Vicodin if she needed.  She did not take the Vicodin  On Saturday she was going up some steps the knee pop she had acute pain and had to go back to the emergency room.  X-rays were taken there and they were noted to show arthritis advanced for age  She presents now walking with a crutch still having moderate pain in her left knee.  No prior history of surgery.  No history of athletics other than cheerleading  Does not smoke nondiabetic no history of hypertension no blood thinners        Past Medical History:  Diagnosis Date   Dyspnea           Past Surgical History:  Procedure Laterality Date   APPENDECTOMY       COLONOSCOPY N/A 03/30/2018    Procedure: COLONOSCOPY;  Surgeon: Corbin Ade, MD;  [facility]: [facility];  Service: Endoscopy;  Laterality: N/A;  12:00   TUBAL LIGATION          BP (!) 159/64   Pulse 66   Ht 5' (1.524 m)   Wt 117 lb (53.1 kg)   BMI 22.85 kg/m      She is awake and alert she is oriented x3   Mood and affect is pleasant   Gait is notable for a limp favoring the left knee with a crutch noted   The right knee is noted to have no effusion no tenderness full range of motion no ligament instability and normal muscle tone   The left knee is quite tender flexes to about 45 degrees tender especially over the medial joint line there is no effusion muscle tone is normal the knee feels stable to Lachman testing  X-ray from [facility] shows osteoarthritis of the left knee advanced for age there are secondary bone changes there is narrowing of the joint spaces especially medially.  She does not  appear to have any significant varus deformity  Assessment and plan  Osteoarthritis with acute pain possible meniscal tear versus loose body  Preoperative treatment included hinged knee brace active range of motion continue Celebrex take Vicodin as needed  MRI was obtained which showed degeneration of all 3 compartments and degenerative tear medial meniscus which did not appear to be large enough to cause current symptoms  There was some bone edema in the proximal tibia   Arthroscopy left knee partial medial meniscectomy debride as needed

## 2021-01-01 DIAGNOSIS — Z23 Encounter for immunization: Secondary | ICD-10-CM | POA: Diagnosis not present

## 2021-01-04 ENCOUNTER — Other Ambulatory Visit: Payer: Self-pay

## 2021-01-04 ENCOUNTER — Encounter: Payer: Self-pay | Admitting: Orthopedic Surgery

## 2021-01-04 ENCOUNTER — Ambulatory Visit (INDEPENDENT_AMBULATORY_CARE_PROVIDER_SITE_OTHER): Payer: BC Managed Care – PPO | Admitting: Orthopedic Surgery

## 2021-01-04 DIAGNOSIS — Z4889 Encounter for other specified surgical aftercare: Secondary | ICD-10-CM

## 2021-01-04 DIAGNOSIS — Z9889 Other specified postprocedural states: Secondary | ICD-10-CM

## 2021-01-04 NOTE — Addendum Note (Signed)
Addended by: Cherre Huger E on: 01/04/2021 04:02 PM   Modules accepted: Orders

## 2021-01-04 NOTE — Patient Instructions (Signed)
BRACE X 3 WEEKS   ICE 20 MIN  IBUPROFEN 800 2 X A DAY  PRONE HANGS 30 MIN A NIGHT  THERAPY 3 X A WEEK X 3 WEEKS

## 2021-01-04 NOTE — Progress Notes (Signed)
Chief Complaint  Patient presents with   Knee Pain    Postop left knee scope/DOS 12/15/20, doing well, not taking any pain meds.    Evelyn Moyer is improving in terms of her pain she is not really taking any medication for pain at this time however we have noticed that she is developing a flexion contracture in the knee.  Her flexion is approximately 105 degrees but she has a 15 degree extension deficit even in the prone position she has a fairly significant heel height difference  She has mild swelling in the knee I do not think it needs to be drained as there is not that much fluid in it and its not preventing 90 degrees of flexion  I advise she continue to wear the brace for 3 weeks to treat the subchondral fracture  She will ice the knee 20 minutes she is having difficulty with that is causing the leg to ache  I have advised her to take ibuprofen 800 mg twice a day  Prone hangs 30 minutes every evening and to start therapy 3 times a week  She will continue her bracing and see me in 3 weeks

## 2021-01-19 ENCOUNTER — Other Ambulatory Visit: Payer: Self-pay

## 2021-01-19 ENCOUNTER — Encounter (HOSPITAL_COMMUNITY): Payer: Self-pay | Admitting: Physical Therapy

## 2021-01-19 ENCOUNTER — Ambulatory Visit (HOSPITAL_COMMUNITY): Payer: BC Managed Care – PPO | Attending: Orthopedic Surgery | Admitting: Physical Therapy

## 2021-01-19 DIAGNOSIS — G8929 Other chronic pain: Secondary | ICD-10-CM

## 2021-01-19 DIAGNOSIS — M25562 Pain in left knee: Secondary | ICD-10-CM | POA: Diagnosis not present

## 2021-01-19 DIAGNOSIS — M6281 Muscle weakness (generalized): Secondary | ICD-10-CM | POA: Insufficient documentation

## 2021-01-19 DIAGNOSIS — R262 Difficulty in walking, not elsewhere classified: Secondary | ICD-10-CM | POA: Diagnosis not present

## 2021-01-19 NOTE — Therapy (Signed)
[facility] [address] [place], [place], [address] Phone: [phone]   Fax:  [phone]  Physical Therapy Evaluation  Patient Details  Name: Evelyn Moyer MRN: [id number] Date of Birth: Dec 09, 1965 Referring Provider (PT): Fuller Canada  ROM Left 32-98  Encounter Date: 01/19/2021   PT End of Session - 01/19/21 0829     Visit Number 1    Number of Visits 18    Date for PT Re-Evaluation 03/02/21    Authorization Type BCBS COMM PPO 30 VL with 0 used, no auth    Authorization - Visit Number 1    Authorization - Number of Visits 30    Progress Note Due on Visit 10    PT Start Time 0830    PT Stop Time 0905    PT Time Calculation (min) 35 min             Past Medical History:  Diagnosis Date   Panic attacks     Past Surgical History:  Procedure Laterality Date   APPENDECTOMY     COLONOSCOPY N/A 03/30/2018   Procedure: COLONOSCOPY;  Surgeon: Corbin Ade, MD;  [facility];  Service: Endoscopy;  Laterality: N/A;  12:00   KNEE ARTHROSCOPY WITH MEDIAL MENISECTOMY Left 12/15/2020   Procedure: KNEE ARTHROSCOPY WITH MEDIAL MENISECTOMY;  Surgeon: Vickki Hearing, MD;  Location: [facility];  Service: Orthopedics;  Laterality: Left;   TUBAL LIGATION      There were no vitals filed for this visit.    Subjective Assessment - 01/19/21 0904     Subjective States that she has had knee pain for a while. States she was going up the stairs and she had a pop and had intense pain. States that she went had bene to the ED a couple of time for pain. States that she went to the MD and he did an MRI which showed a torn meniscus and he gave her a hinged knee brace and elected to have surgery on 12/15/20. States she has been wearing the brace all the time. Reports no  real pain. She has been doing 45 degree squats, heel slides and prone knee hangs. Reports she lifts weights, does yoga, walks and wants to get back to it.    Pertinent  History s/p partial medial meniscectomy debridement lateral meniscus and chondroplasty lateral tibial plateau on left    Limitations Walking;Standing    Patient Stated Goals to get back to walking, yoga and weight lifting    Currently in Pain? 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    Leisure yoga, lifting weight, walking      Cognition   Overall Cognitive Status Within Functional Limits for tasks assessed      Observation/Other Assessments   Observations swelling surrounding left knee  Focus on Therapeutic Outcomes (FOTO)  50% function      ROM / Strength   AROM / PROM / Strength AROM;Strength      AROM   AROM Assessment Site Knee    Right/Left Knee Right;Left    Right Knee Extension 0    Right Knee Flexion 125    Left Knee Extension 32   lacking   Left Knee Flexion 98      Strength   Overall Strength Comments able to perform fair quad activation on left    Strength Assessment Site Knee;Hip      Palpation   Patella mobility hypomobility noted in left patellat      Ambulation/Gait   Ambulation/Gait Yes    Assistive device None    Gait Pattern Decreased stride length;Decreased hip/knee flexion - left;Decreased step length - right;Antalgic    Ambulation Surface Level;Indoor    Gait velocity reduced    Gait Comments with brace                        Objective measurements completed on examination: See above findings.       [facility] Adult PT Treatment/Exercise -  01/19/21 0001       Exercises   Exercises Knee/Hip      Knee/Hip Exercises: Stretches   Gastroc Stretch Left;30 seconds;3 reps   strao     Knee/Hip Exercises: Supine   Quad Sets 10 reps   bilateral tactile and visual cues, 10" holds   Heel Slides --   3 minutes with strap                    PT Education - 01/19/21 0904     Education Details on current condition, on HEP, on FOTO score, on weaning from brace    Person(s) Educated Patient    Methods Explanation    Comprehension Verbalized understanding              PT Short Term Goals - 01/19/21 0905       PT SHORT TERM GOAL #1   Title Patient will report at least 50% improvement in overall symptoms and/or function to demonstrate improved functional mobility    Time 3    Period Weeks    Status New    Target Date 02/09/21      PT SHORT TERM GOAL #2   Title Patient will demonstrate 5-120 degrees of left knee ROM    Time 3    Period Weeks    Status New    Target Date 02/09/21      PT SHORT TERM GOAL #3   Title Patient will be independent in self management strategies to improve quality of life and functional outcomes.    Time 3    Period Weeks    Status New    Target Date 02/09/21               PT Long Term Goals - 01/19/21 0906       PT LONG TERM GOAL #1   Title Patient will report at least 75% improvement in overall symptoms and/or function to demonstrate improved functional mobility    Time 6    Period Weeks    Status New    Target Date 03/02/21      PT LONG TERM GOAL #2   Title Patient will be able to walk at least 300 feet without brace or compensatory gait strategies to demonstrate improved  functional mobility    Time 6    Period Weeks    Status New    Target Date 03/02/21      PT LONG TERM GOAL #3   Title Patient will meet predicted FOTO score to demonstrate improved overall function.    Time 6    Period Weeks    Status New    Target Date 03/02/21                     Plan - 01/19/21 0907     Clinical Impression Statement Patient is a 55 y.o. female who presents to physical therapy with complaint of left knee weakness after left knee scope on 12/12/20 with subchondral fracture. Patient presents with hinged knee brace with instructions from MD to wear until week 01/25/21. Patient demonstrates decreased strength, ROM restriction, balance deficits and gait abnormalities which are likely contributing to symptoms of pain and are negatively impacting patient ability to perform ADLs and functional mobility tasks. Patient will benefit from skilled physical therapy services to address these deficits to reduce pain, improve level of function with ADLs, functional mobility tasks, and reduce risk for falls.    Examination-Activity Limitations Bend;Squat;Stairs;Stand;Locomotion Level    Examination-Participation Restrictions Yard Work;Community Activity    Stability/Clinical Decision Making Stable/Uncomplicated    Clinical Decision Making Low    Rehab Potential Good    PT Frequency 3x / week    PT Duration 6 weeks    PT Treatment/Interventions ADLs/Self Care Home Management;Cryotherapy;Electrical Stimulation;Moist Heat;Therapeutic exercise;Therapeutic activities;Manual techniques;Stair training;Gait training;DME Instruction;Patient/family education;Neuromuscular re-education;Dry needling;Joint Manipulations    PT Next Visit Plan quad strengthenging, knee ROM - start to wean from brace week of 10/17, gait mechanics    PT Home Exercise Plan calf stretch, heel slides, quad sets    Consulted and Agree with Plan of Care Patient             Patient will benefit from skilled therapeutic intervention in order to improve the following deficits and impairments:  Pain, Decreased strength, Decreased activity tolerance, Difficulty walking, Decreased mobility, Decreased range of motion, Decreased knowledge of precautions, Decreased knowledge of use of DME, Decreased  skin integrity  Visit Diagnosis: Chronic pain of left knee  Difficulty in walking, not elsewhere classified  Muscle weakness (generalized)     Problem List Patient Active Problem List   Diagnosis Date Noted   Derangement of posterior horn of medial meniscus of left knee    S/P left knee arthroscopy 12/15/20 12/22/2020   9:12 AM, 01/19/21 Tereasa Coop, DPT Physical Therapy with [facility]  [phone] office   [facility] [address] [place], [place], [address] Phone: [phone]   Fax:  [phone]  Name: Evelyn Moyer MRN: [id number] Date of Birth: Jul 31, 1965

## 2021-01-20 ENCOUNTER — Ambulatory Visit (HOSPITAL_COMMUNITY): Payer: BC Managed Care – PPO

## 2021-01-21 ENCOUNTER — Ambulatory Visit (HOSPITAL_COMMUNITY): Payer: BC Managed Care – PPO | Admitting: Physical Therapy

## 2021-01-22 ENCOUNTER — Encounter (HOSPITAL_COMMUNITY): Payer: Self-pay

## 2021-01-22 ENCOUNTER — Ambulatory Visit (HOSPITAL_COMMUNITY): Payer: BC Managed Care – PPO

## 2021-01-22 ENCOUNTER — Other Ambulatory Visit: Payer: Self-pay

## 2021-01-22 DIAGNOSIS — R262 Difficulty in walking, not elsewhere classified: Secondary | ICD-10-CM | POA: Diagnosis not present

## 2021-01-22 DIAGNOSIS — G8929 Other chronic pain: Secondary | ICD-10-CM

## 2021-01-22 DIAGNOSIS — M6281 Muscle weakness (generalized): Secondary | ICD-10-CM

## 2021-01-22 DIAGNOSIS — M25562 Pain in left knee: Secondary | ICD-10-CM | POA: Diagnosis not present

## 2021-01-22 NOTE — Therapy (Signed)
[facility] [address] [place], [place], [address] Phone: [phone]   Fax:  [phone]  Physical Therapy Treatment  Patient Details  Name: Evelyn Moyer MRN: [id number] Date of Birth: 05/12/1965 Referring Provider (PT): Fuller Canada   Encounter Date: 01/22/2021   PT End of Session - 01/22/21 0840     Visit Number 2    Number of Visits 18    Date for PT Re-Evaluation 03/02/21    Authorization Type BCBS COMM PPO 30 VL with 0 used, no auth    Authorization - Visit Number 2    Authorization - Number of Visits 30    Progress Note Due on Visit 10    PT Start Time 0815    PT Stop Time 0900    PT Time Calculation (min) 45 min             Past Medical History:  Diagnosis Date   Panic attacks     Past Surgical History:  Procedure Laterality Date   APPENDECTOMY     COLONOSCOPY N/A 03/30/2018   Procedure: COLONOSCOPY;  Surgeon: Corbin Ade, MD;  [facility];  Service: Endoscopy;  Laterality: N/A;  12:00   KNEE ARTHROSCOPY WITH MEDIAL MENISECTOMY Left 12/15/2020   Procedure: KNEE ARTHROSCOPY WITH MEDIAL MENISECTOMY;  Surgeon: Vickki Hearing, MD;  Location: [facility];  Service: Orthopedics;  Laterality: Left;   TUBAL LIGATION      There were no vitals filed for this visit.   Subjective Assessment - 01/22/21 0837     Subjective Pt notes continued stiffness in the left knee and difficulty with straightening the knee. She has been without the hinged brace since Thursday    Pertinent History s/p partial medial meniscectomy debridement lateral meniscus and chondroplasty lateral tibial plateau on left    Limitations Walking;Standing    Patient Stated Goals to get back to walking, yoga and weight lifting    Currently in Pain? No/denies                [facility] PT Assessment - 01/22/21 0001       Assessment   Medical Diagnosis left knee scope    Referring Provider (PT) Fuller Canada    Onset  Date/Surgical Date 12/15/20                           [facility] Adult PT Treatment/Exercise - 01/22/21 0001       Knee/Hip Exercises: Supine   Quad Sets Strengthening    Quad Sets Limitations perfmed in tandem with [place] e-stim    Heel Slides AROM;Left;2 sets;10 reps    Heel Slides Limitations with swiss ball    Bridges with Clamshell Strengthening;Both;2 sets;10 reps    Patellar Mobs 4-way      Modalities   Modalities Geologist, engineering Location left quadirceps    Engineer, manufacturing Russian    Electrical Stimulation Parameters 75 bps, 23 mA x 20 min 4 sec on/12off    Electrical Stimulation Goals Strength                     PT Education - 01/22/21 [id number]     Education Details education on ice, compression, elevation. Supervision and tactile cues accompanying e-stim for quad facilitation    Person(s) Educated Patient    Methods Explanation    Comprehension Verbalized understanding  PT Short Term Goals - 01/19/21 0905       PT SHORT TERM GOAL #1   Title Patient will report at least 50% improvement in overall symptoms and/or function to demonstrate improved functional mobility    Time 3    Period Weeks    Status New    Target Date 02/09/21      PT SHORT TERM GOAL #2   Title Patient will demonstrate 5-120 degrees of left knee ROM    Time 3    Period Weeks    Status New    Target Date 02/09/21      PT SHORT TERM GOAL #3   Title Patient will be independent in self management strategies to improve quality of life and functional outcomes.    Time 3    Period Weeks    Status New    Target Date 02/09/21               PT Long Term Goals - 01/19/21 0906       PT LONG TERM GOAL #1   Title Patient will report at least 75% improvement in overall symptoms and/or function to demonstrate improved functional mobility    Time 6    Period Weeks    Status New     Target Date 03/02/21      PT LONG TERM GOAL #2   Title Patient will be able to walk at least 300 feet without brace or compensatory gait strategies to demonstrate improved functional mobility    Time 6    Period Weeks    Status New    Target Date 03/02/21      PT LONG TERM GOAL #3   Title Patient will meet predicted FOTO score to demonstrate improved overall function.    Time 6    Period Weeks    Status New    Target Date 03/02/21                   Plan - 01/22/21 0857     Clinical Impression Statement Demonstrates continued stiffness in left knee and difficulty in achieving terminal knee extension and presents with some swelling in joint and accompanying ambulation with flexed knee posture in stance phase. Tolerating new HEP activities well. Continued session indicated to improved left knee ROM/strength and to normalize gait pattern    Examination-Activity Limitations Bend;Squat;Stairs;Stand;Locomotion Level    Examination-Participation Restrictions Yard Work;Community Activity    Stability/Clinical Decision Making Stable/Uncomplicated    Rehab Potential Good    PT Frequency 3x / week    PT Duration 6 weeks    PT Treatment/Interventions ADLs/Self Care Home Management;Cryotherapy;Electrical Stimulation;Moist Heat;Therapeutic exercise;Therapeutic activities;Manual techniques;Stair training;Gait training;DME Instruction;Patient/family education;Neuromuscular re-education;Dry needling;Joint Manipulations    PT Next Visit Plan quad strengthenging, knee ROM - start to wean from brace week of 10/17, gait mechanics    PT Home Exercise Plan calf stretch, heel slides, quad sets    Consulted and Agree with Plan of Care Patient             Patient will benefit from skilled therapeutic intervention in order to improve the following deficits and impairments:  Pain, Decreased strength, Decreased activity tolerance, Difficulty walking, Decreased mobility, Decreased range of motion,  Decreased knowledge of precautions, Decreased knowledge of use of DME, Decreased skin integrity  Visit Diagnosis: Chronic pain of left knee  Difficulty in walking, not elsewhere classified  Muscle weakness (generalized)     Problem List Patient Active Problem List  Diagnosis Date Noted   Derangement of posterior horn of medial meniscus of left knee    S/P left knee arthroscopy 12/15/20 12/22/2020    Dion Body, PT 01/22/2021, 9:12 AM  [facility] [address] [place], [place], [address] Phone: [phone]   Fax:  [phone]  Name: NAKIMA FLUEGGE MRN: [id number] Date of Birth: 09/23/1965

## 2021-01-25 ENCOUNTER — Ambulatory Visit (HOSPITAL_COMMUNITY): Payer: BC Managed Care – PPO | Admitting: Physical Therapy

## 2021-01-25 ENCOUNTER — Ambulatory Visit (INDEPENDENT_AMBULATORY_CARE_PROVIDER_SITE_OTHER): Payer: BC Managed Care – PPO | Admitting: Orthopedic Surgery

## 2021-01-25 ENCOUNTER — Other Ambulatory Visit: Payer: Self-pay

## 2021-01-25 DIAGNOSIS — R262 Difficulty in walking, not elsewhere classified: Secondary | ICD-10-CM

## 2021-01-25 DIAGNOSIS — Z9889 Other specified postprocedural states: Secondary | ICD-10-CM

## 2021-01-25 DIAGNOSIS — M6281 Muscle weakness (generalized): Secondary | ICD-10-CM

## 2021-01-25 DIAGNOSIS — G8929 Other chronic pain: Secondary | ICD-10-CM | POA: Diagnosis not present

## 2021-01-25 DIAGNOSIS — Z4889 Encounter for other specified surgical aftercare: Secondary | ICD-10-CM

## 2021-01-25 DIAGNOSIS — M25562 Pain in left knee: Secondary | ICD-10-CM

## 2021-01-25 NOTE — Progress Notes (Signed)
Chief Complaint  Patient presents with   S/P left knee arthroscopy    DOS 12/15/20    Postop status postarthroscopy left knee, probable proximal tibial fracture  Patient is relieved herself from the brace still has a significant flexion contracture as a noticeable limp decreased flexion of the joint but it is improving  No pain no swelling except when she is standing for long period of time she will have some pain  Heel height difference still significantly high  Recommend prone hangs more diligence and more frequent and weight as needed  Follow-up in 4 weeks  Patient has been in therapy 3 of 18 prescribed visits

## 2021-01-25 NOTE — Therapy (Signed)
[facility] [address] [place], [place], [address] Phone: [phone]   Fax:  [phone]  Physical Therapy Treatment  Patient Details  Name: Evelyn Moyer MRN: [id number] Date of Birth: [birth year] Referring Provider (PT): Fuller Canada   Encounter Date: 01/25/2021   PT End of Session - 01/25/21 0826     Visit Number 3    Number of Visits 18    Date for PT Re-Evaluation 03/02/21    Authorization Type BCBS COMM PPO 30 VL with 0 used, no auth    Authorization - Visit Number 3    Authorization - Number of Visits 30    Progress Note Due on Visit 10    PT Start Time 0820    PT Stop Time 0900    PT Time Calculation (min) 40 min             Past Medical History:  Diagnosis Date   Panic attacks     Past Surgical History:  Procedure Laterality Date   APPENDECTOMY     COLONOSCOPY N/A 03/30/2018   Procedure: COLONOSCOPY;  Surgeon: Corbin Ade, MD;  [facility];  Service: Endoscopy;  Laterality: N/A;  12:00   KNEE ARTHROSCOPY WITH MEDIAL MENISECTOMY Left 12/15/2020   Procedure: KNEE ARTHROSCOPY WITH MEDIAL MENISECTOMY;  Surgeon: Vickki Hearing, MD;  Location: [facility];  Service: Orthopedics;  Laterality: Left;   TUBAL LIGATION      There were no vitals filed for this visit.   Subjective Assessment - 01/25/21 0825     Subjective It getting better. Still stiff but getting better. Doing well with HEP.    Pertinent History s/p partial medial meniscectomy debridement lateral meniscus and chondroplasty lateral tibial plateau on left    Limitations Walking;Standing    Patient Stated Goals to get back to walking, yoga and weight lifting    Currently in Pain? No/denies                               [facility] Adult PT Treatment/Exercise - 01/25/21 0001       Knee/Hip Exercises: Stretches   Passive Hamstring Stretch Left;3 reps;30 seconds      Knee/Hip Exercises: Supine   Quad Sets Left;15  reps    Heel Slides 1 set;AAROM;10 reps    Heel Slides Limitations with strap    Bridges 2 sets;10 reps    Straight Leg Raises Left;2 sets;10 reps    Knee Extension AROM;Left    Knee Extension Limitations 5    Knee Flexion AROM;Left    Knee Flexion Limitations 101      Knee/Hip Exercises: Prone   Other Prone Exercises LT knee flexion AAROM with contract relax                       PT Short Term Goals - 01/19/21 0905       PT SHORT TERM GOAL #1   Title Patient will report at least 50% improvement in overall symptoms and/or function to demonstrate improved functional mobility    Time 3    Period Weeks    Status New    Target Date 02/09/21      PT SHORT TERM GOAL #2   Title Patient will demonstrate 5-120 degrees of left knee ROM    Time 3    Period Weeks    Status New    Target Date  02/09/21      PT SHORT TERM GOAL #3   Title Patient will be independent in self management strategies to improve quality of life and functional outcomes.    Time 3    Period Weeks    Status New    Target Date 02/09/21               PT Long Term Goals - 01/19/21 0906       PT LONG TERM GOAL #1   Title Patient will report at least 75% improvement in overall symptoms and/or function to demonstrate improved functional mobility    Time 6    Period Weeks    Status New    Target Date 03/02/21      PT LONG TERM GOAL #2   Title Patient will be able to walk at least 300 feet without brace or compensatory gait strategies to demonstrate improved functional mobility    Time 6    Period Weeks    Status New    Target Date 03/02/21      PT LONG TERM GOAL #3   Title Patient will meet predicted FOTO score to demonstrate improved overall function.    Time 6    Period Weeks    Status New    Target Date 03/02/21                   Plan - 01/25/21 0901     Clinical Impression Statement Patient continues to struggle with knee flexion AROM. She has improved extension  fairly well. Focused session on stretching, quad activation and mobilizations. Educated patient on proper patellar mobilization technique as patient reports she was having a hard time with this. Educated patient on AAROM heel slides and passive hamstring stretching to add to HEP for improved knee AROM. Patient will continue to benefit from skilled therapy services to progress knee strength and ROM for decreased pain and improved functional ability.    Examination-Activity Limitations Bend;Squat;Stairs;Stand;Locomotion Level    Examination-Participation Restrictions Yard Work;Community Activity    Stability/Clinical Decision Making Stable/Uncomplicated    Rehab Potential Good    PT Frequency 3x / week    PT Duration 6 weeks    PT Treatment/Interventions ADLs/Self Care Home Management;Cryotherapy;Electrical Stimulation;Moist Heat;Therapeutic exercise;Therapeutic activities;Manual techniques;Stair training;Gait training;DME Instruction;Patient/family education;Neuromuscular re-education;Dry needling;Joint Manipulations    PT Next Visit Plan quad strengthenging, knee ROM.    PT Home Exercise Plan calf stretch, heel slides, quad sets 10/17 AAROM heel slide, hamstring strech    Consulted and Agree with Plan of Care Patient             Patient will benefit from skilled therapeutic intervention in order to improve the following deficits and impairments:  Pain, Decreased strength, Decreased activity tolerance, Difficulty walking, Decreased mobility, Decreased range of motion, Decreased knowledge of precautions, Decreased knowledge of use of DME, Decreased skin integrity  Visit Diagnosis: Chronic pain of left knee  Difficulty in walking, not elsewhere classified  Muscle weakness (generalized)     Problem List Patient Active Problem List   Diagnosis Date Noted   Derangement of posterior horn of medial meniscus of left knee    S/P left knee arthroscopy 12/15/20 12/22/2020   9:03 AM,  01/25/21 Georges Lynch PT DPT  Physical Therapist with [place]  [facility]  [phone]   [facility] [address] [place], [place], [address] Phone: [phone]   Fax:  [phone]  Name: Evelyn Moyer MRN: [id number] Date of Birth: 1965-04-16

## 2021-01-27 ENCOUNTER — Encounter (HOSPITAL_COMMUNITY): Payer: Self-pay

## 2021-01-27 ENCOUNTER — Ambulatory Visit (HOSPITAL_COMMUNITY): Payer: BC Managed Care – PPO

## 2021-01-27 ENCOUNTER — Other Ambulatory Visit: Payer: Self-pay

## 2021-01-27 DIAGNOSIS — M6281 Muscle weakness (generalized): Secondary | ICD-10-CM

## 2021-01-27 DIAGNOSIS — M25562 Pain in left knee: Secondary | ICD-10-CM | POA: Diagnosis not present

## 2021-01-27 DIAGNOSIS — R262 Difficulty in walking, not elsewhere classified: Secondary | ICD-10-CM | POA: Diagnosis not present

## 2021-01-27 DIAGNOSIS — G8929 Other chronic pain: Secondary | ICD-10-CM

## 2021-01-27 NOTE — Therapy (Signed)
[facility] [address] [place], [place], [address] Phone: [phone]   Fax:  [phone]  Physical Therapy Treatment  Patient Details  Name: Evelyn Moyer MRN: [id number] Date of Birth: [birth year] Referring Provider (PT): Fuller Canada   Encounter Date: 01/27/2021   PT End of Session - 01/27/21 0823     Visit Number 4    Number of Visits 18    Date for PT Re-Evaluation 03/02/21    Authorization Type BCBS COMM PPO 30 VL with 0 used, no auth    Authorization - Visit Number 4    Authorization - Number of Visits 30    Progress Note Due on Visit 10    PT Start Time 0815    PT Stop Time 0900    PT Time Calculation (min) 45 min             Past Medical History:  Diagnosis Date   Panic attacks     Past Surgical History:  Procedure Laterality Date   APPENDECTOMY     COLONOSCOPY N/A 03/30/2018   Procedure: COLONOSCOPY;  Surgeon: Corbin Ade, MD;  [facility];  Service: Endoscopy;  Laterality: N/A;  12:00   KNEE ARTHROSCOPY WITH MEDIAL MENISECTOMY Left 12/15/2020   Procedure: KNEE ARTHROSCOPY WITH MEDIAL MENISECTOMY;  Surgeon: Vickki Hearing, MD;  Location: [facility];  Service: Orthopedics;  Laterality: Left;   TUBAL LIGATION      There were no vitals filed for this visit.   Subjective Assessment - 01/27/21 0821     Subjective Biggest issue is stiffness and lack of ROM, not too painful    Pertinent History s/p partial medial meniscectomy debridement lateral meniscus and chondroplasty lateral tibial plateau on left    Limitations Walking;Standing    Patient Stated Goals to get back to walking, yoga and weight lifting    Currently in Pain? No/denies                               [facility] Adult PT Treatment/Exercise - 01/27/21 0001       Knee/Hip Exercises: Stretches   Gastroc Stretch Both;3 reps;30 seconds    Gastroc Stretch Limitations slantboard      Knee/Hip Exercises: Machines  for Strengthening   Cybex Knee Flexion 3 plates, 3G18      Knee/Hip Exercises: Standing   Heel Raises Both;2 sets;10 reps    Rocker Board 2 minutes    Other Standing Knee Exercises weight shifting on airex pad x 2.5 min. Small march in place on airex x 2 min      Knee/Hip Exercises: Seated   Heel Slides AAROM;Both;5 sets;10 reps      Knee/Hip Exercises: Supine   Quad Sets Strengthening;Left;4 sets;10 reps    Knee Extension AROM;Left    Knee Extension Limitations 5   lacking   Knee Flexion AROM;Left    Knee Flexion Limitations 105                       PT Short Term Goals - 01/19/21 0905       PT SHORT TERM GOAL #1   Title Patient will report at least 50% improvement in overall symptoms and/or function to demonstrate improved functional mobility    Time 3    Period Weeks    Status New    Target Date 02/09/21      PT SHORT  TERM GOAL #2   Title Patient will demonstrate 5-120 degrees of left knee ROM    Time 3    Period Weeks    Status New    Target Date 02/09/21      PT SHORT TERM GOAL #3   Title Patient will be independent in self management strategies to improve quality of life and functional outcomes.    Time 3    Period Weeks    Status New    Target Date 02/09/21               PT Long Term Goals - 01/19/21 0906       PT LONG TERM GOAL #1   Title Patient will report at least 75% improvement in overall symptoms and/or function to demonstrate improved functional mobility    Time 6    Period Weeks    Status New    Target Date 03/02/21      PT LONG TERM GOAL #2   Title Patient will be able to walk at least 300 feet without brace or compensatory gait strategies to demonstrate improved functional mobility    Time 6    Period Weeks    Status New    Target Date 03/02/21      PT LONG TERM GOAL #3   Title Patient will meet predicted FOTO score to demonstrate improved overall function.    Time 6    Period Weeks    Status New    Target Date  03/02/21                   Plan - 01/27/21 0906     Clinical Impression Statement Lacking left knee ROM for extension and flexion. Pt reports she performs prone knee hangs with weight for approx 30 minutes daily. Diffuclty with LLE weight acceptance and stability in single limb or biased position. COntinued sessions indicated to improve strength, ROM, balance, and normalize gait patern    Examination-Activity Limitations Bend;Squat;Stairs;Stand;Locomotion Level    Examination-Participation Restrictions Yard Work;Community Activity    Stability/Clinical Decision Making Stable/Uncomplicated    Rehab Potential Good    PT Frequency 3x / week    PT Duration 6 weeks    PT Treatment/Interventions ADLs/Self Care Home Management;Cryotherapy;Electrical Stimulation;Moist Heat;Therapeutic exercise;Therapeutic activities;Manual techniques;Stair training;Gait training;DME Instruction;Patient/family education;Neuromuscular re-education;Dry needling;Joint Manipulations    PT Next Visit Plan quad strengthenging, knee ROM.    PT Home Exercise Plan calf stretch, heel slides, quad sets 10/17 AAROM heel slide, hamstring strech    Consulted and Agree with Plan of Care Patient             Patient will benefit from skilled therapeutic intervention in order to improve the following deficits and impairments:  Pain, Decreased strength, Decreased activity tolerance, Difficulty walking, Decreased mobility, Decreased range of motion, Decreased knowledge of precautions, Decreased knowledge of use of DME, Decreased skin integrity  Visit Diagnosis: Chronic pain of left knee  Difficulty in walking, not elsewhere classified  Muscle weakness (generalized)     Problem List Patient Active Problem List   Diagnosis Date Noted   Derangement of posterior horn of medial meniscus of left knee    S/P left knee arthroscopy 12/15/20 12/22/2020    Dion Body, PT 01/27/2021, 9:08 AM  [facility] [address] [place], [place], [address] Phone: [phone]   Fax:  [phone]  Name: Evelyn Moyer MRN: [id number] Date of Birth: 1966/02/20

## 2021-01-29 ENCOUNTER — Encounter (HOSPITAL_COMMUNITY): Payer: BC Managed Care – PPO | Admitting: Physical Therapy

## 2021-02-01 ENCOUNTER — Ambulatory Visit (HOSPITAL_COMMUNITY): Payer: BC Managed Care – PPO

## 2021-02-01 ENCOUNTER — Other Ambulatory Visit: Payer: Self-pay

## 2021-02-01 DIAGNOSIS — M25562 Pain in left knee: Secondary | ICD-10-CM

## 2021-02-01 DIAGNOSIS — R262 Difficulty in walking, not elsewhere classified: Secondary | ICD-10-CM | POA: Diagnosis not present

## 2021-02-01 DIAGNOSIS — M6281 Muscle weakness (generalized): Secondary | ICD-10-CM | POA: Diagnosis not present

## 2021-02-01 DIAGNOSIS — G8929 Other chronic pain: Secondary | ICD-10-CM

## 2021-02-01 NOTE — Therapy (Signed)
[facility] [address] [place], [place], [address] Phone: [phone]   Fax:  [phone]  Physical Therapy Treatment  Patient Details  Name: Evelyn Moyer MRN: [id number] Date of Birth: Feb 28, 1966 Referring Provider (PT): Fuller Canada   Encounter Date: 02/01/2021   PT End of Session - 02/01/21 0819     Visit Number 5    Number of Visits 18    Date for PT Re-Evaluation 03/02/21    Authorization Type BCBS COMM PPO 30 VL with 0 used, no auth    Authorization - Visit Number 5    Authorization - Number of Visits 30    Progress Note Due on Visit 10    PT Start Time 0815    PT Stop Time 0900    PT Time Calculation (min) 45 min             Past Medical History:  Diagnosis Date   Panic attacks     Past Surgical History:  Procedure Laterality Date   APPENDECTOMY     COLONOSCOPY N/A 03/30/2018   Procedure: COLONOSCOPY;  Surgeon: Corbin Ade, MD;  [facility];  Service: Endoscopy;  Laterality: N/A;  12:00   KNEE ARTHROSCOPY WITH MEDIAL MENISECTOMY Left 12/15/2020   Procedure: KNEE ARTHROSCOPY WITH MEDIAL MENISECTOMY;  Surgeon: Vickki Hearing, MD;  Location: [facility];  Service: Orthopedics;  Laterality: Left;   TUBAL LIGATION      There were no vitals filed for this visit.   Subjective Assessment - 02/01/21 0820     Subjective Difficult time with left knee extension    Pertinent History s/p partial medial meniscectomy debridement lateral meniscus and chondroplasty lateral tibial plateau on left    Limitations Walking;Standing    Patient Stated Goals to get back to walking, yoga and weight lifting    Currently in Pain? No/denies                [facility] PT Assessment - 02/01/21 0001       Assessment   Medical Diagnosis left knee scope    Referring Provider (PT) Fuller Canada    Onset Date/Surgical Date 12/15/20                           [facility] Adult PT Treatment/Exercise -  02/01/21 0001       Knee/Hip Exercises: Standing   Forward Lunges Both;5 sets;5 reps    Forward Lunges Limitations on foam    Functional Squat 2 sets;10 reps    Functional Squat Limitations on foam. Heels elevated goblet squat 2x10 8 lbs.    Other Standing Knee Exercises kneeling knee flexion/DF stretch: knee to wall      Manual Therapy   Manual Therapy Joint mobilization    Manual therapy comments completed separate from other interventions    Joint Mobilization Grade 1,2,3 distraction left tib-fem and coupled with post. tibial glide to improve terminal knee extension                       PT Short Term Goals - 01/19/21 0905       PT SHORT TERM GOAL #1   Title Patient will report at least 50% improvement in overall symptoms and/or function to demonstrate improved functional mobility    Time 3    Period Weeks    Status New    Target Date 02/09/21  PT SHORT TERM GOAL #2   Title Patient will demonstrate 5-120 degrees of left knee ROM    Time 3    Period Weeks    Status New    Target Date 02/09/21      PT SHORT TERM GOAL #3   Title Patient will be independent in self management strategies to improve quality of life and functional outcomes.    Time 3    Period Weeks    Status New    Target Date 02/09/21               PT Long Term Goals - 01/19/21 0906       PT LONG TERM GOAL #1   Title Patient will report at least 75% improvement in overall symptoms and/or function to demonstrate improved functional mobility    Time 6    Period Weeks    Status New    Target Date 03/02/21      PT LONG TERM GOAL #2   Title Patient will be able to walk at least 300 feet without brace or compensatory gait strategies to demonstrate improved functional mobility    Time 6    Period Weeks    Status New    Target Date 03/02/21      PT LONG TERM GOAL #3   Title Patient will meet predicted FOTO score to demonstrate improved overall function.    Time 6    Period  Weeks    Status New    Target Date 03/02/21                   Plan - 02/01/21 0859     Clinical Impression Statement Progressing with POC details and demonstrating improved tolerance for exercise routine at home setting.  Springy end-feel at left tib-fem for passive extension. Tolerated joint mobilizations well to knee. Continued sessions indicated to improve left knee ROM, strength, symmetry for gait    Examination-Activity Limitations Bend;Squat;Stairs;Stand;Locomotion Level    Examination-Participation Restrictions Yard Work;Community Activity    Stability/Clinical Decision Making Stable/Uncomplicated    Rehab Potential Good    PT Frequency 3x / week    PT Duration 6 weeks    PT Treatment/Interventions ADLs/Self Care Home Management;Cryotherapy;Electrical Stimulation;Moist Heat;Therapeutic exercise;Therapeutic activities;Manual techniques;Stair training;Gait training;DME Instruction;Patient/family education;Neuromuscular re-education;Dry needling;Joint Manipulations    PT Next Visit Plan quad strengthenging, knee ROM.    PT Home Exercise Plan calf stretch, heel slides, quad sets 10/17 AAROM heel slide, hamstring strech    Consulted and Agree with Plan of Care Patient             Patient will benefit from skilled therapeutic intervention in order to improve the following deficits and impairments:  Pain, Decreased strength, Decreased activity tolerance, Difficulty walking, Decreased mobility, Decreased range of motion, Decreased knowledge of precautions, Decreased knowledge of use of DME, Decreased skin integrity  Visit Diagnosis: Chronic pain of left knee  Difficulty in walking, not elsewhere classified  Muscle weakness (generalized)     Problem List Patient Active Problem List   Diagnosis Date Noted   Derangement of posterior horn of medial meniscus of left knee    S/P left knee arthroscopy 12/15/20 12/22/2020    Dion Body, PT 02/01/2021, 9:01 AM  [facility] [address] [place], [place], [address] Phone: [phone]   Fax:  [phone]  Name: Evelyn Moyer MRN: [id number] Date of Birth: [birth year]

## 2021-02-03 ENCOUNTER — Encounter (HOSPITAL_COMMUNITY): Payer: Self-pay | Admitting: Physical Therapy

## 2021-02-03 ENCOUNTER — Ambulatory Visit (HOSPITAL_COMMUNITY): Payer: BC Managed Care – PPO | Admitting: Physical Therapy

## 2021-02-03 ENCOUNTER — Other Ambulatory Visit: Payer: Self-pay

## 2021-02-03 DIAGNOSIS — M6281 Muscle weakness (generalized): Secondary | ICD-10-CM | POA: Diagnosis not present

## 2021-02-03 DIAGNOSIS — R262 Difficulty in walking, not elsewhere classified: Secondary | ICD-10-CM | POA: Diagnosis not present

## 2021-02-03 DIAGNOSIS — M25562 Pain in left knee: Secondary | ICD-10-CM | POA: Diagnosis not present

## 2021-02-03 DIAGNOSIS — G8929 Other chronic pain: Secondary | ICD-10-CM | POA: Diagnosis not present

## 2021-02-03 NOTE — Therapy (Signed)
[facility] [address] [place], [place], [address] Phone: [phone]   Fax:  [phone]  Physical Therapy Treatment  Patient Details  Name: Evelyn Moyer MRN: [id number] Date of Birth: 1965-09-26 Referring Provider (PT): Fuller Canada   Encounter Date: 02/03/2021   PT End of Session - 02/03/21 0826     Visit Number 6    Number of Visits 18    Date for PT Re-Evaluation 03/02/21    Authorization Type BCBS COMM PPO 30 VL with 0 used, no auth    Authorization - Visit Number 6    Authorization - Number of Visits 30    Progress Note Due on Visit 10    PT Start Time 0820    PT Stop Time 0900    PT Time Calculation (min) 40 min    Activity Tolerance Patient tolerated treatment well    Behavior During Therapy [facility] for tasks assessed/performed             Past Medical History:  Diagnosis Date   Panic attacks     Past Surgical History:  Procedure Laterality Date   APPENDECTOMY     COLONOSCOPY N/A 03/30/2018   Procedure: COLONOSCOPY;  Surgeon: Corbin Ade, MD;  [facility];  Service: Endoscopy;  Laterality: N/A;  12:00   KNEE ARTHROSCOPY WITH MEDIAL MENISECTOMY Left 12/15/2020   Procedure: KNEE ARTHROSCOPY WITH MEDIAL MENISECTOMY;  Surgeon: Vickki Hearing, MD;  Location: [facility];  Service: Orthopedics;  Laterality: Left;   TUBAL LIGATION      There were no vitals filed for this visit.   Subjective Assessment - 02/03/21 0825     Subjective "It's getting better". No pain, still notes ongoing stiffness.    Pertinent History s/p partial medial meniscectomy debridement lateral meniscus and chondroplasty lateral tibial plateau on left    Limitations Walking;Standing    Patient Stated Goals to get back to walking, yoga and weight lifting    Currently in Pain? No/denies                               [facility] Adult PT Treatment/Exercise - 02/03/21 0001       Knee/Hip Exercises: Standing    Forward Step Up Both;1 set;10 reps;Hand Hold: 1;Step Height: 4"    Step Down 1 set;10 reps;Hand Hold: 1;Step Height: 4";Both    Functional Squat 1 set;15 reps   to chair for depth cue     Knee/Hip Exercises: Supine   Quad Sets Left;10 reps    Heel Slides Left;10 reps    Knee Extension AROM;Left    Knee Extension Limitations 8    Knee Flexion AROM;Left    Knee Flexion Limitations 108      Manual Therapy   Manual Therapy Joint mobilization;Passive ROM    Manual therapy comments completed separate from other interventions    Joint Mobilization Patellar mobs all planes grade II for ROM    Passive ROM LT knee extension, with tibial ER mobs grade I-II                       PT Short Term Goals - 01/19/21 0905       PT SHORT TERM GOAL #1   Title Patient will report at least 50% improvement in overall symptoms and/or function to demonstrate improved functional mobility    Time 3    Period Weeks  Status New    Target Date 02/09/21      PT SHORT TERM GOAL #2   Title Patient will demonstrate 5-120 degrees of left knee ROM    Time 3    Period Weeks    Status New    Target Date 02/09/21      PT SHORT TERM GOAL #3   Title Patient will be independent in self management strategies to improve quality of life and functional outcomes.    Time 3    Period Weeks    Status New    Target Date 02/09/21               PT Long Term Goals - 01/19/21 0906       PT LONG TERM GOAL #1   Title Patient will report at least 75% improvement in overall symptoms and/or function to demonstrate improved functional mobility    Time 6    Period Weeks    Status New    Target Date 03/02/21      PT LONG TERM GOAL #2   Title Patient will be able to walk at least 300 feet without brace or compensatory gait strategies to demonstrate improved functional mobility    Time 6    Period Weeks    Status New    Target Date 03/02/21      PT LONG TERM GOAL #3   Title Patient will meet  predicted FOTO score to demonstrate improved overall function.    Time 6    Period Weeks    Status New    Target Date 03/02/21                   Plan - 02/03/21 0854     Clinical Impression Statement Patient continues to be limited by AROM limitations. Upon further assessment, discovered patient is still weak in LT quad and shows limited activation with quad sets. Also noted inhibition with functional squatting and stairs. This is likely large contributor to patient limited knee extensions. Discussed findings with patient and restructured HEP. Plan to reintroduce NMES for quad activation. Continue manual and strengthening for improved quad function and knee AROM.    Examination-Activity Limitations Bend;Squat;Stairs;Stand;Locomotion Level    Examination-Participation Restrictions Yard Work;Community Activity    Stability/Clinical Decision Making Stable/Uncomplicated    Rehab Potential Good    PT Frequency 3x / week    PT Duration 6 weeks    PT Treatment/Interventions ADLs/Self Care Home Management;Cryotherapy;Electrical Stimulation;Moist Heat;Therapeutic exercise;Therapeutic activities;Manual techniques;Stair training;Gait training;DME Instruction;Patient/family education;Neuromuscular re-education;Dry needling;Joint Manipulations    PT Next Visit Plan quad strengthenging, knee ROM. NMES, eccentric quads    PT Home Exercise Plan calf stretch, heel slides, quad sets 10/17 AAROM heel slide, hamstring strech 10/26 SLR, step up, eccentric chair squats, AAROM heel slides    Consulted and Agree with Plan of Care Patient             Patient will benefit from skilled therapeutic intervention in order to improve the following deficits and impairments:  Pain, Decreased strength, Decreased activity tolerance, Difficulty walking, Decreased mobility, Decreased range of motion, Decreased knowledge of precautions, Decreased knowledge of use of DME, Decreased skin integrity  Visit  Diagnosis: Chronic pain of left knee  Difficulty in walking, not elsewhere classified  Muscle weakness (generalized)     Problem List Patient Active Problem List   Diagnosis Date Noted   Derangement of posterior horn of medial meniscus of left knee    S/P left knee  arthroscopy 12/15/20 12/22/2020   9:01 AM, 02/03/21 Georges Lynch PT DPT  Physical Therapist with [facility]  [facility]  [phone]   [facility] [address] [place], [place], [address] Phone: [phone]   Fax:  [phone]  Name: Evelyn Moyer MRN: [id number] Date of Birth: [birth year]

## 2021-02-03 NOTE — Patient Instructions (Signed)
Access Code: [email or web address] URL: [email or web address] Date: 02/03/2021 Prepared by: Georges Lynch  Exercises Supine Heel Slide with Strap - 3 x daily - 7 x weekly - 2 sets - 10 reps - 5 second hold Active Straight Leg Raise with Quad Set - 3 x daily - 7 x weekly - 3 sets - 10 reps Eccentric Squat - 3 x daily - 7 x weekly - 3 sets - 10 reps - 5 second slow lowering Step Up - 3 x daily - 7 x weekly - 3 sets - 10 reps

## 2021-02-05 ENCOUNTER — Encounter (HOSPITAL_COMMUNITY): Payer: Self-pay

## 2021-02-05 ENCOUNTER — Ambulatory Visit (HOSPITAL_COMMUNITY): Payer: BC Managed Care – PPO

## 2021-02-05 ENCOUNTER — Other Ambulatory Visit: Payer: Self-pay

## 2021-02-05 DIAGNOSIS — R262 Difficulty in walking, not elsewhere classified: Secondary | ICD-10-CM | POA: Diagnosis not present

## 2021-02-05 DIAGNOSIS — G8929 Other chronic pain: Secondary | ICD-10-CM | POA: Diagnosis not present

## 2021-02-05 DIAGNOSIS — M6281 Muscle weakness (generalized): Secondary | ICD-10-CM | POA: Diagnosis not present

## 2021-02-05 DIAGNOSIS — M25562 Pain in left knee: Secondary | ICD-10-CM | POA: Diagnosis not present

## 2021-02-05 NOTE — Therapy (Signed)
[facility] [address] [place], [place], [address] Phone: [phone]   Fax:  [phone]  Physical Therapy Treatment  Patient Details  Name: Evelyn Moyer MRN: [id number] Date of Birth: 10/18/1965 Referring Provider (PT): Fuller Canada   Encounter Date: 02/05/2021   PT End of Session - 02/05/21 0828     Visit Number 7    Number of Visits 18    Date for PT Re-Evaluation 03/02/21    Authorization Type BCBS COMM PPO 30 VL with 0 used, no auth    Authorization - Visit Number 7    Authorization - Number of Visits 30    Progress Note Due on Visit 10    PT Start Time 0815    PT Stop Time 0900    PT Time Calculation (min) 45 min    Activity Tolerance Patient tolerated treatment well    Behavior During Therapy [facility] for tasks assessed/performed             Past Medical History:  Diagnosis Date   Panic attacks     Past Surgical History:  Procedure Laterality Date   APPENDECTOMY     COLONOSCOPY N/A 03/30/2018   Procedure: COLONOSCOPY;  Surgeon: Corbin Ade, MD;  [facility];  Service: Endoscopy;  Laterality: N/A;  12:00   KNEE ARTHROSCOPY WITH MEDIAL MENISECTOMY Left 12/15/2020   Procedure: KNEE ARTHROSCOPY WITH MEDIAL MENISECTOMY;  Surgeon: Vickki Hearing, MD;  Location: [facility];  Service: Orthopedics;  Laterality: Left;   TUBAL LIGATION      There were no vitals filed for this visit.   Subjective Assessment - 02/05/21 0818     Subjective Left knee stiffness    Pertinent History s/p partial medial meniscectomy debridement lateral meniscus and chondroplasty lateral tibial plateau on left    Limitations Walking;Standing    Patient Stated Goals to get back to walking, yoga and weight lifting                               [facility] Adult PT Treatment/Exercise - 02/05/21 0001       Knee/Hip Exercises: Stretches   Knee: Self-Stretch to increase Flexion Left      Knee/Hip Exercises:  Machines for Strengthening   Total Gym Leg Press with green t-loop, neutral and sumo position 3x10      Knee/Hip Exercises: Standing   Terminal Knee Extension Strengthening;Left;3 sets;10 reps    Terminal Knee Extension Limitations 3 plates    Functional Squat 3 sets;10 reps   3x10 on compliant surface   Functional Squat Limitations 8 lbs dead lift-partial range      Knee/Hip Exercises: Supine   Knee Extension AROM;Left    Knee Extension Limitations 6    Knee Flexion AROM;Left    Knee Flexion Limitations 111      Manual Therapy   Manual Therapy Joint mobilization;Passive ROM    Manual therapy comments completed separate from other interventions    Joint Mobilization Patellar mobs all planes grade II for ROM    Passive ROM LT knee extension, with tibial ER mobs grade I-II                       PT Short Term Goals - 01/19/21 0905       PT SHORT TERM GOAL #1   Title Patient will report at least 50% improvement in overall symptoms and/or  function to demonstrate improved functional mobility    Time 3    Period Weeks    Status New    Target Date 02/09/21      PT SHORT TERM GOAL #2   Title Patient will demonstrate 5-120 degrees of left knee ROM    Time 3    Period Weeks    Status New    Target Date 02/09/21      PT SHORT TERM GOAL #3   Title Patient will be independent in self management strategies to improve quality of life and functional outcomes.    Time 3    Period Weeks    Status New    Target Date 02/09/21               PT Long Term Goals - 01/19/21 0906       PT LONG TERM GOAL #1   Title Patient will report at least 75% improvement in overall symptoms and/or function to demonstrate improved functional mobility    Time 6    Period Weeks    Status New    Target Date 03/02/21      PT LONG TERM GOAL #2   Title Patient will be able to walk at least 300 feet without brace or compensatory gait strategies to demonstrate improved functional  mobility    Time 6    Period Weeks    Status New    Target Date 03/02/21      PT LONG TERM GOAL #3   Title Patient will meet predicted FOTO score to demonstrate improved overall function.    Time 6    Period Weeks    Status New    Target Date 03/02/21                   Plan - 02/05/21 0902     Clinical Impression Statement Progressing with POC details and demonstrating some improvement in ROM and strength.  Focus on symmetry during WBing/bilateral movements. Tolerating activities well. Continued sessions to improve left knee ROM, strength, and normalize gait pattern    Examination-Activity Limitations Bend;Squat;Stairs;Stand;Locomotion Level    Examination-Participation Restrictions Yard Work;Community Activity    Stability/Clinical Decision Making Stable/Uncomplicated    Rehab Potential Good    PT Frequency 3x / week    PT Duration 6 weeks    PT Treatment/Interventions ADLs/Self Care Home Management;Cryotherapy;Electrical Stimulation;Moist Heat;Therapeutic exercise;Therapeutic activities;Manual techniques;Stair training;Gait training;DME Instruction;Patient/family education;Neuromuscular re-education;Dry needling;Joint Manipulations    PT Next Visit Plan quad strengthenging, knee ROM. NMES, eccentric quads    PT Home Exercise Plan calf stretch, heel slides, quad sets 10/17 AAROM heel slide, hamstring strech 10/26 SLR, step up, eccentric chair squats, AAROM heel slides    Consulted and Agree with Plan of Care Patient             Patient will benefit from skilled therapeutic intervention in order to improve the following deficits and impairments:  Pain, Decreased strength, Decreased activity tolerance, Difficulty walking, Decreased mobility, Decreased range of motion, Decreased knowledge of precautions, Decreased knowledge of use of DME, Decreased skin integrity  Visit Diagnosis: Chronic pain of left knee  Difficulty in walking, not elsewhere classified  Muscle  weakness (generalized)     Problem List Patient Active Problem List   Diagnosis Date Noted   Derangement of posterior horn of medial meniscus of left knee    S/P left knee arthroscopy 12/15/20 12/22/2020    Dion Body, PT 02/05/2021, 9:04 AM  [place]  [facility] [address] [place], [place], [address] Phone: [phone]   Fax:  [phone]  Name: SEINI LANNOM MRN: [id number] Date of Birth: 09/29/1965

## 2021-02-08 ENCOUNTER — Ambulatory Visit (HOSPITAL_COMMUNITY): Payer: BC Managed Care – PPO | Admitting: Physical Therapy

## 2021-02-08 ENCOUNTER — Other Ambulatory Visit: Payer: Self-pay

## 2021-02-08 ENCOUNTER — Encounter (HOSPITAL_COMMUNITY): Payer: Self-pay | Admitting: Physical Therapy

## 2021-02-08 DIAGNOSIS — R262 Difficulty in walking, not elsewhere classified: Secondary | ICD-10-CM | POA: Diagnosis not present

## 2021-02-08 DIAGNOSIS — M6281 Muscle weakness (generalized): Secondary | ICD-10-CM

## 2021-02-08 DIAGNOSIS — G8929 Other chronic pain: Secondary | ICD-10-CM

## 2021-02-08 DIAGNOSIS — M25562 Pain in left knee: Secondary | ICD-10-CM

## 2021-02-08 NOTE — Therapy (Signed)
[facility] [address] [place], [place], [address] Phone: [phone]   Fax:  [phone]  Physical Therapy Treatment  Patient Details  Name: Evelyn Moyer MRN: [id number] Date of Birth: 18-Jan-1966 Referring Provider (PT): Fuller Canada   Encounter Date: 02/08/2021   PT End of Session - 02/08/21 0823     Visit Number 8    Number of Visits 18    Date for PT Re-Evaluation 03/02/21    Authorization Type BCBS COMM PPO 30 VL with 0 used, no auth    Authorization - Visit Number 8    Authorization - Number of Visits 30    Progress Note Due on Visit 10    PT Start Time 0820    PT Stop Time 0900    PT Time Calculation (min) 40 min    Activity Tolerance Patient tolerated treatment well    Behavior During Therapy [facility] for tasks assessed/performed             Past Medical History:  Diagnosis Date   Panic attacks     Past Surgical History:  Procedure Laterality Date   APPENDECTOMY     COLONOSCOPY N/A 03/30/2018   Procedure: COLONOSCOPY;  Surgeon: Corbin Ade, MD;  [facility];  Service: Endoscopy;  Laterality: N/A;  12:00   KNEE ARTHROSCOPY WITH MEDIAL MENISECTOMY Left 12/15/2020   Procedure: KNEE ARTHROSCOPY WITH MEDIAL MENISECTOMY;  Surgeon: Vickki Hearing, MD;  Location: [facility];  Service: Orthopedics;  Laterality: Left;   TUBAL LIGATION      There were no vitals filed for this visit.   Subjective Assessment - 02/08/21 0823     Subjective Patient say she has been doing her exercise. Thinks she is doing better, still stiff in morning.    Pertinent History s/p partial medial meniscectomy debridement lateral meniscus and chondroplasty lateral tibial plateau on left    Limitations Walking;Standing    Patient Stated Goals to get back to walking, yoga and weight lifting    Currently in Pain? No/denies                               [facility] Adult PT Treatment/Exercise - 02/08/21 0001        Knee/Hip Exercises: Standing   Forward Step Up Left;2 sets;10 reps;Hand Hold: 1;Step Height: 6"    Step Down 10 reps;Hand Hold: 1;Step Height: 4";2 sets    Functional Squat 2 sets;10 reps    Functional Squat Limitations to chair for depth cue, 3 sec eccentric, focus on even weight shift    Other Standing Knee Exercises heel tap down, eccentric focus 2 x 10 LT 4 inch      Knee/Hip Exercises: Supine   Quad Sets Left;10 reps;2 sets   2nd set with russian stim   Heel Slides Left;10 reps    Straight Leg Raises Left;2 sets;10 reps    Knee Extension AROM;Left    Knee Extension Limitations 5   post stim and manual   Knee Flexion AROM;Left    Knee Flexion Limitations 110    Other Supine Knee/Hip Exercises heel prop 2 minutes      Modalities   Modalities Electrical Stimulation      Electrical Stimulation   Electrical Stimulation Location left quadriceps    Electrical Stimulation Action Russian stim    Electrical Stimulation Parameters Russian stim 5/5 sec, 38 mA 6 min    Electrical  Stimulation Goals Strength;Neuromuscular facilitation      Manual Therapy   Manual Therapy Passive ROM    Manual therapy comments completed separate from other interventions    Passive ROM LT knee extension with 10-15 sec holds                       PT Short Term Goals - 01/19/21 0905       PT SHORT TERM GOAL #1   Title Patient will report at least 50% improvement in overall symptoms and/or function to demonstrate improved functional mobility    Time 3    Period Weeks    Status New    Target Date 02/09/21      PT SHORT TERM GOAL #2   Title Patient will demonstrate 5-120 degrees of left knee ROM    Time 3    Period Weeks    Status New    Target Date 02/09/21      PT SHORT TERM GOAL #3   Title Patient will be independent in self management strategies to improve quality of life and functional outcomes.    Time 3    Period Weeks    Status New    Target Date 02/09/21                PT Long Term Goals - 01/19/21 0906       PT LONG TERM GOAL #1   Title Patient will report at least 75% improvement in overall symptoms and/or function to demonstrate improved functional mobility    Time 6    Period Weeks    Status New    Target Date 03/02/21      PT LONG TERM GOAL #2   Title Patient will be able to walk at least 300 feet without brace or compensatory gait strategies to demonstrate improved functional mobility    Time 6    Period Weeks    Status New    Target Date 03/02/21      PT LONG TERM GOAL #3   Title Patient will meet predicted FOTO score to demonstrate improved overall function.    Time 6    Period Weeks    Status New    Target Date 03/02/21                   Plan - 02/08/21 0851     Clinical Impression Statement Patient continues to be limited with LT quad activation. Performed NMEs with good result. Patient notes improved strength with squatting following. Still demos apprehension and shifts weight to RT with functional squatting but is able to improve with cues. Educated patient on adding weight with heel prop at home for improved knee extension stretching. Patient will continue to benefit from skilled therapy services to reduce deficits and improve functional ability.    Examination-Activity Limitations Bend;Squat;Stairs;Stand;Locomotion Level    Examination-Participation Restrictions Yard Work;Community Activity    Stability/Clinical Decision Making Stable/Uncomplicated    Rehab Potential Good    PT Frequency 3x / week    PT Duration 6 weeks    PT Treatment/Interventions ADLs/Self Care Home Management;Cryotherapy;Electrical Stimulation;Moist Heat;Therapeutic exercise;Therapeutic activities;Manual techniques;Stair training;Gait training;DME Instruction;Patient/family education;Neuromuscular re-education;Dry needling;Joint Manipulations    PT Next Visit Plan quad strengthenging, knee ROM. NMES, eccentric quads    PT Home Exercise Plan  calf stretch, heel slides, quad sets 10/17 AAROM heel slide, hamstring strech 10/26 SLR, step up, eccentric chair squats, AAROM heel slides 10/31 heel prop w weight  Consulted and Agree with Plan of Care Patient             Patient will benefit from skilled therapeutic intervention in order to improve the following deficits and impairments:  Pain, Decreased strength, Decreased activity tolerance, Difficulty walking, Decreased mobility, Decreased range of motion, Decreased knowledge of precautions, Decreased knowledge of use of DME, Decreased skin integrity  Visit Diagnosis: Chronic pain of left knee  Difficulty in walking, not elsewhere classified  Muscle weakness (generalized)     Problem List Patient Active Problem List   Diagnosis Date Noted   Derangement of posterior horn of medial meniscus of left knee    S/P left knee arthroscopy 12/15/20 12/22/2020   9:00 AM, 02/08/21 Georges Lynch PT DPT  Physical Therapist with [place]  [facility]  [phone]   [facility] [address] [place], [place], [address] Phone: [phone]   Fax:  [phone]  Name: BRINLYN CENA MRN: [id number] Date of Birth: 05-Mar-1966

## 2021-02-08 NOTE — Patient Instructions (Signed)
Access Code: [email or web address] URL: [email or web address] Date: 02/08/2021 Prepared by: Georges Lynch  Exercises Supine Knee Extension Mobilization with Weight - 3 x daily - 7 x weekly - 1 sets - 1 reps - 2-3 minute hold

## 2021-02-10 ENCOUNTER — Other Ambulatory Visit: Payer: Self-pay

## 2021-02-10 ENCOUNTER — Ambulatory Visit (HOSPITAL_COMMUNITY): Payer: BC Managed Care – PPO | Attending: Orthopedic Surgery | Admitting: Physical Therapy

## 2021-02-10 DIAGNOSIS — G8929 Other chronic pain: Secondary | ICD-10-CM | POA: Insufficient documentation

## 2021-02-10 DIAGNOSIS — R262 Difficulty in walking, not elsewhere classified: Secondary | ICD-10-CM | POA: Diagnosis not present

## 2021-02-10 DIAGNOSIS — M6281 Muscle weakness (generalized): Secondary | ICD-10-CM | POA: Diagnosis not present

## 2021-02-10 DIAGNOSIS — M25562 Pain in left knee: Secondary | ICD-10-CM | POA: Insufficient documentation

## 2021-02-10 NOTE — Therapy (Signed)
[facility] [address] [place], [place], [address] Phone: [phone]   Fax:  [phone]  Physical Therapy Treatment  Patient Details  Name: Evelyn Moyer MRN: [id number] Date of Birth: 1965/04/15 Referring Provider (PT): Fuller Canada   Encounter Date: 02/10/2021   PT End of Session - 02/10/21 0956     Visit Number 9    Number of Visits 18    Date for PT Re-Evaluation 03/02/21    Authorization Type BCBS COMM PPO 30 VL with 0 used, no auth    Authorization - Visit Number 9    Authorization - Number of Visits 30    Progress Note Due on Visit 10    PT Start Time 0830    PT Stop Time 0915    PT Time Calculation (min) 45 min    Activity Tolerance Patient tolerated treatment well    Behavior During Therapy [facility] for tasks assessed/performed             Past Medical History:  Diagnosis Date   Panic attacks     Past Surgical History:  Procedure Laterality Date   APPENDECTOMY     COLONOSCOPY N/A 03/30/2018   Procedure: COLONOSCOPY;  Surgeon: Corbin Ade, MD;  [facility];  Service: Endoscopy;  Laterality: N/A;  12:00   KNEE ARTHROSCOPY WITH MEDIAL MENISECTOMY Left 12/15/2020   Procedure: KNEE ARTHROSCOPY WITH MEDIAL MENISECTOMY;  Surgeon: Vickki Hearing, MD;  Location: [facility];  Service: Orthopedics;  Laterality: Left;   TUBAL LIGATION      There were no vitals filed for this visit.   Subjective Assessment - 02/10/21 0850     Subjective Pt states it is stiff but not really hurting.  States she's been compliant with HEP    Currently in Pain? No/denies                               [facility] Adult PT Treatment/Exercise - 02/10/21 0001       Knee/Hip Exercises: Standing   Lateral Step Up Left;2 sets;10 reps;Step Height: 4";Hand Hold: 1    Lateral Step Up Limitations eccentric lowering    Forward Step Up Left;2 sets;10 reps;Hand Hold: 1;Step Height: 6"    Step Down 10 reps;Hand Hold:  1;Step Height: 4";2 sets    Step Down Limitations eccentric lowering    Functional Squat 2 sets;10 reps    Functional Squat Limitations to chair for depth cue, 3 sec eccentric, focus on even weight shift      Knee/Hip Exercises: Supine   Quad Sets Left;10 reps;2 sets    Short Arc Quad Sets Limitations    Short Arc Quad Sets Limitations with russian stim    Heel Slides Left;10 reps    Knee Extension AROM;Left    Knee Extension Limitations 4    Knee Flexion AROM;Left    Knee Flexion Limitations 110      Modalities   Modalities Geologist, engineering Location left quadriceps    Electrical Stimulation Action Water engineer Parameters Russian 5/5, 30 mA , 8 minutes    Electrical Stimulation Goals Strength;Neuromuscular facilitation      Manual Therapy   Manual Therapy Passive ROM;Soft tissue mobilization    Manual therapy comments completed separate from other interventions    Soft tissue mobilization scar massage; self instruction  Passive ROM LT knee extension with 10-15 sec holds                       PT Short Term Goals - 01/19/21 0905       PT SHORT TERM GOAL #1   Title Patient will report at least 50% improvement in overall symptoms and/or function to demonstrate improved functional mobility    Time 3    Period Weeks    Status New    Target Date 02/09/21      PT SHORT TERM GOAL #2   Title Patient will demonstrate 5-120 degrees of left knee ROM    Time 3    Period Weeks    Status New    Target Date 02/09/21      PT SHORT TERM GOAL #3   Title Patient will be independent in self management strategies to improve quality of life and functional outcomes.    Time 3    Period Weeks    Status New    Target Date 02/09/21               PT Long Term Goals - 01/19/21 0906       PT LONG TERM GOAL #1   Title Patient will report at least 75% improvement in overall symptoms  and/or function to demonstrate improved functional mobility    Time 6    Period Weeks    Status New    Target Date 03/02/21      PT LONG TERM GOAL #2   Title Patient will be able to walk at least 300 feet without brace or compensatory gait strategies to demonstrate improved functional mobility    Time 6    Period Weeks    Status New    Target Date 03/02/21      PT LONG TERM GOAL #3   Title Patient will meet predicted FOTO score to demonstrate improved overall function.    Time 6    Period Weeks    Status New    Target Date 03/02/21                   Plan - 02/10/21 1001     Clinical Impression Statement Pt with exercise compliance; doing knee hangs at home to increase extension.  Began with [place] estim for endrange quadricep strength.  AROM following 4 degrees from neutral.  Pt c/o lateral knee "catching" when transitioning from extension to flexion around 70 degrees.  Instructed with scar massage as she did not have this catching following manual.  Continued with step up/down activities with eccentric focus with lowering.  Manual and tactile cues to reduce medial knee trajectory and reduce hip substitution.    Examination-Activity Limitations Bend;Squat;Stairs;Stand;Locomotion Level    Examination-Participation Restrictions Yard Work;Community Activity    Stability/Clinical Decision Making Stable/Uncomplicated    Rehab Potential Good    PT Frequency 3x / week    PT Duration 6 weeks    PT Treatment/Interventions ADLs/Self Care Home Management;Cryotherapy;Electrical Stimulation;Moist Heat;Therapeutic exercise;Therapeutic activities;Manual techniques;Stair training;Gait training;DME Instruction;Patient/family education;Neuromuscular re-education;Dry needling;Joint Manipulations    PT Next Visit Plan quad strengthenging, knee ROM. NMES, eccentric quads.  complete 10th visit PN next session    PT Home Exercise Plan calf stretch, heel slides, quad sets 10/17 AAROM heel slide,  hamstring strech 10/26 SLR, step up, eccentric chair squats, AAROM heel slides 10/31 heel prop w weight    Consulted and Agree with Plan of Care Patient  Patient will benefit from skilled therapeutic intervention in order to improve the following deficits and impairments:  Pain, Decreased strength, Decreased activity tolerance, Difficulty walking, Decreased mobility, Decreased range of motion, Decreased knowledge of precautions, Decreased knowledge of use of DME, Decreased skin integrity  Visit Diagnosis: Chronic pain of left knee  Muscle weakness (generalized)  Difficulty in walking, not elsewhere classified     Problem List Patient Active Problem List   Diagnosis Date Noted   Derangement of posterior horn of medial meniscus of left knee    S/P left knee arthroscopy 12/15/20 12/22/2020   Lurena Nida, PTA/CLT, WTA [phone]  Lurena Nida, PTA 02/10/2021, 10:01 AM  [facility] [address] [place], [place], [address] Phone: [phone]   Fax:  [phone]  Name: GUSTAVIA CARIE MRN: [id number] Date of Birth: [birth year]

## 2021-02-12 ENCOUNTER — Other Ambulatory Visit: Payer: Self-pay

## 2021-02-12 ENCOUNTER — Ambulatory Visit (HOSPITAL_COMMUNITY): Payer: BC Managed Care – PPO

## 2021-02-12 DIAGNOSIS — G8929 Other chronic pain: Secondary | ICD-10-CM | POA: Diagnosis not present

## 2021-02-12 DIAGNOSIS — M25562 Pain in left knee: Secondary | ICD-10-CM

## 2021-02-12 DIAGNOSIS — M6281 Muscle weakness (generalized): Secondary | ICD-10-CM

## 2021-02-12 DIAGNOSIS — R262 Difficulty in walking, not elsewhere classified: Secondary | ICD-10-CM | POA: Diagnosis not present

## 2021-02-12 NOTE — Therapy (Signed)
[facility] [address] [place], [place], [address] Phone: [phone]   Fax:  [phone]  Physical Therapy Treatment and Progress Note  Patient Details  Name: Evelyn Moyer MRN: [id number] Date of Birth: 03/03/1966 Referring Provider (PT): Fuller Canada  Progress Note Reporting Period 01/19/21 to 02/12/21  See note below for Objective Data and Assessment of Progress/Goals.     Encounter Date: 02/12/2021   PT End of Session - 02/12/21 0821     Visit Number 10    Number of Visits 18    Date for PT Re-Evaluation 03/02/21    Authorization Type BCBS COMM PPO 30 VL with 0 used, no auth    Authorization - Visit Number 10    Authorization - Number of Visits 30    Progress Note Due on Visit 20    PT Start Time 0815    PT Stop Time 0900    PT Time Calculation (min) 45 min    Activity Tolerance Patient tolerated treatment well    Behavior During Therapy WFL for tasks assessed/performed             Past Medical History:  Diagnosis Date   Panic attacks     Past Surgical History:  Procedure Laterality Date   APPENDECTOMY     COLONOSCOPY N/A 03/30/2018   Procedure: COLONOSCOPY;  Surgeon: Corbin Ade, MD;  [facility];  Service: Endoscopy;  Laterality: N/A;  12:00   KNEE ARTHROSCOPY WITH MEDIAL MENISECTOMY Left 12/15/2020   Procedure: KNEE ARTHROSCOPY WITH MEDIAL MENISECTOMY;  Surgeon: Vickki Hearing, MD;  Location: [facility];  Service: Orthopedics;  Laterality: Left;   TUBAL LIGATION      There were no vitals filed for this visit.   Subjective Assessment - 02/12/21 0902     Subjective Pt states it is stiff but not really hurting.  States she's been compliant with HEP    Pertinent History s/p partial medial meniscectomy debridement lateral meniscus and chondroplasty lateral tibial plateau on left    Currently in Pain? No/denies                [facility] PT Assessment - 02/12/21 0001       Assessment    Medical Diagnosis left knee scope    Referring Provider (PT) Fuller Canada    Onset Date/Surgical Date 12/15/20    Next MD Visit 02/22/21                           [facility] Adult PT Treatment/Exercise - 02/12/21 0001       Knee/Hip Exercises: Standing   Terminal Knee Extension Strengthening;Left;3 sets;10 reps    Terminal Knee Extension Limitations 3 plates    Step Down Left;5 sets;5 reps;Step Height: 2"    Step Down Limitations eccentric lowering    Functional Squat 2 sets;10 reps    Functional Squat Limitations suitcase hold 8# dumbells    Walking with Sports Cord retro-forward w/ 2 plates x 2 min      Knee/Hip Exercises: Supine   Short Arc Quad Sets Strengthening;Left;3 sets;10 reps    Short Arc Quad Sets Limitations 5#    Knee Extension AROM;Left    Knee Extension Limitations 4    Knee Flexion AROM;Left    Knee Flexion Limitations 110      Manual Therapy   Manual Therapy Joint mobilization    Manual therapy comments completed separate from other interventions  Joint Mobilization Patellar mobs all planes grade I-3I for ROM. Anterior translation for knee extension, posterior grade 3 for flexion. Assist for "screw home" mechanism                       PT Short Term Goals - 02/12/21 0825       PT SHORT TERM GOAL #1   Title Patient will report at least 50% improvement in overall symptoms and/or function to demonstrate improved functional mobility    Baseline 40% improvement    Time 3    Period Weeks    Status On-going    Target Date 02/23/21      PT SHORT TERM GOAL #2   Title Patient will demonstrate 5-120 degrees of left knee ROM    Baseline 5-110 currently    Time 3    Period Weeks    Status On-going    Target Date 02/09/21      PT SHORT TERM GOAL #3   Title Patient will be independent in self management strategies to improve quality of life and functional outcomes.    Time 3    Period Weeks    Status On-going    Target Date  02/09/21               PT Long Term Goals - 01/19/21 0906       PT LONG TERM GOAL #1   Title Patient will report at least 75% improvement in overall symptoms and/or function to demonstrate improved functional mobility    Time 6    Period Weeks    Status New    Target Date 03/02/21      PT LONG TERM GOAL #2   Title Patient will be able to walk at least 300 feet without brace or compensatory gait strategies to demonstrate improved functional mobility    Time 6    Period Weeks    Status New    Target Date 03/02/21      PT LONG TERM GOAL #3   Title Patient will meet predicted FOTO score to demonstrate improved overall function.    Time 6    Period Weeks    Status New    Target Date 03/02/21                   Plan - 02/12/21 0902     Clinical Impression Statement Lacking eccentric quad strength/control. Left knee demo springy end-feel with ability to achieve near full extension with manual overpressure.  Continues to ambulate with left knee flexed posture and foot flat intial contact. Continued sessions indicated to progress ROM and strength to normalize gait pattern    Examination-Activity Limitations Bend;Squat;Stairs;Stand;Locomotion Level    Examination-Participation Restrictions Yard Work;Community Activity    Stability/Clinical Decision Making Stable/Uncomplicated    Rehab Potential Good    PT Frequency 3x / week    PT Duration 6 weeks    PT Treatment/Interventions ADLs/Self Care Home Management;Cryotherapy;Electrical Stimulation;Moist Heat;Therapeutic exercise;Therapeutic activities;Manual techniques;Stair training;Gait training;DME Instruction;Patient/family education;Neuromuscular re-education;Dry needling;Joint Manipulations    PT Next Visit Plan quad strengthenging, knee ROM. NMES, eccentric quads.  complete 10th visit PN next session    PT Home Exercise Plan calf stretch, heel slides, quad sets 10/17 AAROM heel slide, hamstring strech 10/26 SLR, step up,  eccentric chair squats, AAROM heel slides 10/31 heel prop w weight    Consulted and Agree with Plan of Care Patient  Patient will benefit from skilled therapeutic intervention in order to improve the following deficits and impairments:  Pain, Decreased strength, Decreased activity tolerance, Difficulty walking, Decreased mobility, Decreased range of motion, Decreased knowledge of precautions, Decreased knowledge of use of DME, Decreased skin integrity  Visit Diagnosis: Chronic pain of left knee  Muscle weakness (generalized)  Difficulty in walking, not elsewhere classified     Problem List Patient Active Problem List   Diagnosis Date Noted   Derangement of posterior horn of medial meniscus of left knee    S/P left knee arthroscopy 12/15/20 12/22/2020    Dion Body, PT 02/12/2021, 9:04 AM  [facility] [address] [place], [place], [address] Phone: [phone]   Fax:  [phone]  Name: ALOHILANI LEVENHAGEN MRN: [id number] Date of Birth: December 22, 1965

## 2021-02-15 ENCOUNTER — Encounter (HOSPITAL_COMMUNITY): Payer: Self-pay | Admitting: Physical Therapy

## 2021-02-15 ENCOUNTER — Ambulatory Visit (HOSPITAL_COMMUNITY): Payer: BC Managed Care – PPO | Admitting: Physical Therapy

## 2021-02-15 ENCOUNTER — Other Ambulatory Visit: Payer: Self-pay

## 2021-02-15 DIAGNOSIS — R262 Difficulty in walking, not elsewhere classified: Secondary | ICD-10-CM | POA: Diagnosis not present

## 2021-02-15 DIAGNOSIS — M6281 Muscle weakness (generalized): Secondary | ICD-10-CM | POA: Diagnosis not present

## 2021-02-15 DIAGNOSIS — G8929 Other chronic pain: Secondary | ICD-10-CM

## 2021-02-15 DIAGNOSIS — M25562 Pain in left knee: Secondary | ICD-10-CM

## 2021-02-15 NOTE — Therapy (Signed)
[facility] [address] [place], [place], [address] Phone: [phone]   Fax:  [phone]  Physical Therapy Treatment  Patient Details  Name: Evelyn Moyer MRN: [id number] Date of Birth: [birth year] Referring Provider (PT): Fuller Canada   Encounter Date: 02/15/2021   PT End of Session - 02/15/21 0752     Visit Number 11    Number of Visits 18    Date for PT Re-Evaluation 03/02/21    Authorization Type BCBS COMM PPO 30 VL with 0 used, no auth    Authorization - Visit Number 11    Authorization - Number of Visits 30    Progress Note Due on Visit 20    PT Start Time 0746    PT Stop Time 0828    PT Time Calculation (min) 42 min    Activity Tolerance Patient tolerated treatment well    Behavior During Therapy [facility] for tasks assessed/performed             Past Medical History:  Diagnosis Date   Panic attacks     Past Surgical History:  Procedure Laterality Date   APPENDECTOMY     COLONOSCOPY N/A 03/30/2018   Procedure: COLONOSCOPY;  Surgeon: Corbin Ade, MD;  [facility]: [facility];  Service: Endoscopy;  Laterality: N/A;  12:00   KNEE ARTHROSCOPY WITH MEDIAL MENISECTOMY Left 12/15/2020   Procedure: KNEE ARTHROSCOPY WITH MEDIAL MENISECTOMY;  Surgeon: Vickki Hearing, MD;  Location: [facility];  Service: Orthopedics;  Laterality: Left;   TUBAL LIGATION      There were no vitals filed for this visit.   Subjective Assessment - 02/15/21 0747     Subjective Reports no pain just stiffness in her knee.    Pertinent History s/p partial medial meniscectomy debridement lateral meniscus and chondroplasty lateral tibial plateau on left    Currently in Pain? No/denies                [facility] PT Assessment - 02/15/21 0001       Assessment   Medical Diagnosis left knee scope    Referring Provider (PT) Fuller Canada                           [facility] Adult PT Treatment/Exercise - 02/15/21 0001        Knee/Hip Exercises: Stretches   Gastroc Stretch Both;3 reps;30 seconds    Gastroc Stretch Limitations slantboard      Knee/Hip Exercises: Aerobic   Stationary Bike warm up 4 minutes seat position 2      Knee/Hip Exercises: Standing   Step Down 3 sets;Step Height: 4";Left   8reps   Other Standing Knee Exercises fwd foot slides 4x 5B - visual cues    Other Standing Knee Exercises TKE retro and regular - x20 10" holds in EXT each - 2 plates; patella mobilizations x20 B 5" holds                       PT Short Term Goals - 02/12/21 0825       PT SHORT TERM GOAL #1   Title Patient will report at least 50% improvement in overall symptoms and/or function to demonstrate improved functional mobility    Baseline 40% improvement    Time 3    Period Weeks    Status On-going    Target Date 02/23/21      PT SHORT TERM GOAL #  2   Title Patient will demonstrate 5-120 degrees of left knee ROM    Baseline 5-110 currently    Time 3    Period Weeks    Status On-going    Target Date 02/09/21      PT SHORT TERM GOAL #3   Title Patient will be independent in self management strategies to improve quality of life and functional outcomes.    Time 3    Period Weeks    Status On-going    Target Date 02/09/21               PT Long Term Goals - 01/19/21 0906       PT LONG TERM GOAL #1   Title Patient will report at least 75% improvement in overall symptoms and/or function to demonstrate improved functional mobility    Time 6    Period Weeks    Status New    Target Date 03/02/21      PT LONG TERM GOAL #2   Title Patient will be able to walk at least 300 feet without brace or compensatory gait strategies to demonstrate improved functional mobility    Time 6    Period Weeks    Status New    Target Date 03/02/21      PT LONG TERM GOAL #3   Title Patient will meet predicted FOTO score to demonstrate improved overall function.    Time 6    Period Weeks    Status New     Target Date 03/02/21                   Plan - 02/15/21 0826     Clinical Impression Statement Session focused on eccentric strength. Visual and verbal cues throughout session. Fatigue in quad noted end of session. Reviewed knee cap mobilization with PT assist as needed. No pain noted end of session Will continue with current POC as tolerated.    Examination-Activity Limitations Bend;Squat;Stairs;Stand;Locomotion Level    Examination-Participation Restrictions Yard Work;Community Activity    Stability/Clinical Decision Making Stable/Uncomplicated    Rehab Potential Good    PT Frequency 3x / week    PT Duration 6 weeks    PT Treatment/Interventions ADLs/Self Care Home Management;Cryotherapy;Electrical Stimulation;Moist Heat;Therapeutic exercise;Therapeutic activities;Manual techniques;Stair training;Gait training;DME Instruction;Patient/family education;Neuromuscular re-education;Dry needling;Joint Manipulations    PT Next Visit Plan quad strengthenging, knee ROM. NMES, eccentric quads.    PT Home Exercise Plan calf stretch, heel slides, quad sets 10/17 AAROM heel slide, hamstring strech 10/26 SLR, step up, eccentric chair squats, AAROM heel slides 10/31 heel prop w weight    Consulted and Agree with Plan of Care Patient             Patient will benefit from skilled therapeutic intervention in order to improve the following deficits and impairments:  Pain, Decreased strength, Decreased activity tolerance, Difficulty walking, Decreased mobility, Decreased range of motion, Decreased knowledge of precautions, Decreased knowledge of use of DME, Decreased skin integrity  Visit Diagnosis: Chronic pain of left knee  Muscle weakness (generalized)  Difficulty in walking, not elsewhere classified     Problem List Patient Active Problem List   Diagnosis Date Noted   Derangement of posterior horn of medial meniscus of left knee    S/P left knee arthroscopy 12/15/20 12/22/2020     8:29 AM, 02/15/21 Tereasa Coop, DPT Physical Therapy with [facility]  [phone] office   [facility] [address]  [place], [place], [address] Phone: [phone]   Fax:  [phone]  Name: Evelyn Moyer MRN: [id number] Date of Birth: 05-10-1965

## 2021-02-17 ENCOUNTER — Other Ambulatory Visit: Payer: Self-pay

## 2021-02-17 ENCOUNTER — Ambulatory Visit (HOSPITAL_COMMUNITY): Payer: BC Managed Care – PPO | Admitting: Physical Therapy

## 2021-02-17 ENCOUNTER — Encounter (HOSPITAL_COMMUNITY): Payer: Self-pay | Admitting: Physical Therapy

## 2021-02-17 DIAGNOSIS — G8929 Other chronic pain: Secondary | ICD-10-CM

## 2021-02-17 DIAGNOSIS — R262 Difficulty in walking, not elsewhere classified: Secondary | ICD-10-CM

## 2021-02-17 DIAGNOSIS — M25562 Pain in left knee: Secondary | ICD-10-CM

## 2021-02-17 DIAGNOSIS — E559 Vitamin D deficiency, unspecified: Secondary | ICD-10-CM | POA: Insufficient documentation

## 2021-02-17 DIAGNOSIS — M6281 Muscle weakness (generalized): Secondary | ICD-10-CM

## 2021-02-17 NOTE — Therapy (Signed)
[facility] [address] [place], [place], [address] Phone: [phone]   Fax:  [phone]  Physical Therapy Treatment  Patient Details  Name: Evelyn Moyer MRN: [id number] Date of Birth: Jun 07, 1965 Referring Provider (PT): Fuller Canada   Encounter Date: 02/17/2021   PT End of Session - 02/17/21 0825     Visit Number 12    Number of Visits 18    Date for PT Re-Evaluation 03/02/21    Authorization Type BCBS COMM PPO 30 VL with 0 used, no auth    Authorization - Visit Number 12    Authorization - Number of Visits 30    Progress Note Due on Visit 20    PT Start Time 0820    PT Stop Time 0859    PT Time Calculation (min) 39 min    Activity Tolerance Patient tolerated treatment well    Behavior During Therapy [facility] for tasks assessed/performed             Past Medical History:  Diagnosis Date   Panic attacks     Past Surgical History:  Procedure Laterality Date   APPENDECTOMY     COLONOSCOPY N/A 03/30/2018   Procedure: COLONOSCOPY;  Surgeon: Corbin Ade, MD;  [facility];  Service: Endoscopy;  Laterality: N/A;  12:00   KNEE ARTHROSCOPY WITH MEDIAL MENISECTOMY Left 12/15/2020   Procedure: KNEE ARTHROSCOPY WITH MEDIAL MENISECTOMY;  Surgeon: Vickki Hearing, MD;  Location: [facility];  Service: Orthopedics;  Laterality: Left;   TUBAL LIGATION      There were no vitals filed for this visit.   Subjective Assessment - 02/17/21 0825     Subjective Doing ok just a little stiff. No pain    Pertinent History s/p partial medial meniscectomy debridement lateral meniscus and chondroplasty lateral tibial plateau on left    Currently in Pain? No/denies                               [facility] Adult PT Treatment/Exercise - 02/17/21 0001       Knee/Hip Exercises: Stretches   Gastroc Stretch Both;3 reps;30 seconds    Gastroc Stretch Limitations slantboard      Knee/Hip Exercises: Aerobic    Stationary Bike warm up 4 minutes Lv 2 seat position 2      Knee/Hip Exercises: Machines for Strengthening   Cybex Knee Extension eccentric lowering (2 up 1 down) 1 plate x 10    Other Machine TKE 4 plates 20 x 3"      Knee/Hip Exercises: Standing   Heel Raises Both;2 sets;10 reps    Heel Raises Limitations incline slope    Step Down Left;2 sets;10 reps;Hand Hold: 0;Step Height: 4"    Other Standing Knee Exercises eccentric heel taps LT x 10 HHA x1( 1 set on 4 inch, 1 set 2 inch)                       PT Short Term Goals - 02/12/21 0825       PT SHORT TERM GOAL #1   Title Patient will report at least 50% improvement in overall symptoms and/or function to demonstrate improved functional mobility    Baseline 40% improvement    Time 3    Period Weeks    Status On-going    Target Date 02/23/21      PT SHORT TERM GOAL #2  Title Patient will demonstrate 5-120 degrees of left knee ROM    Baseline 5-110 currently    Time 3    Period Weeks    Status On-going    Target Date 02/09/21      PT SHORT TERM GOAL #3   Title Patient will be independent in self management strategies to improve quality of life and functional outcomes.    Time 3    Period Weeks    Status On-going    Target Date 02/09/21               PT Long Term Goals - 01/19/21 0906       PT LONG TERM GOAL #1   Title Patient will report at least 75% improvement in overall symptoms and/or function to demonstrate improved functional mobility    Time 6    Period Weeks    Status New    Target Date 03/02/21      PT LONG TERM GOAL #2   Title Patient will be able to walk at least 300 feet without brace or compensatory gait strategies to demonstrate improved functional mobility    Time 6    Period Weeks    Status New    Target Date 03/02/21      PT LONG TERM GOAL #3   Title Patient will meet predicted FOTO score to demonstrate improved overall function.    Time 6    Period Weeks    Status New     Target Date 03/02/21                   Plan - 02/17/21 0856     Clinical Impression Statement Patient continues to be limited by quad weakness. She remains very challenged with eccentric quad activity. Introduced eccentric leg extensions with cues for eccentric control. Patient noting increased muscle fatigue. Also shows ongoing quad stiffness. Reviewed prone quad stretch and encouraged patient to perform this on a regular basis to improved quad flexibility. Patient will continue to benefit from skilled therapy services to address remaining deficits for reduced knee pain and improved functional mobility.    Examination-Activity Limitations Bend;Squat;Stairs;Stand;Locomotion Level    Examination-Participation Restrictions Yard Work;Community Activity    Stability/Clinical Decision Making Stable/Uncomplicated    Rehab Potential Good    PT Frequency 3x / week    PT Duration 6 weeks    PT Treatment/Interventions ADLs/Self Care Home Management;Cryotherapy;Electrical Stimulation;Moist Heat;Therapeutic exercise;Therapeutic activities;Manual techniques;Stair training;Gait training;DME Instruction;Patient/family education;Neuromuscular re-education;Dry needling;Joint Manipulations    PT Next Visit Plan quad strengthenging, knee ROM. NMES, eccentric quads.    PT Home Exercise Plan calf stretch, heel slides, quad sets 10/17 AAROM heel slide, hamstring strech 10/26 SLR, step up, eccentric chair squats, AAROM heel slides 10/31 heel prop w weight 11/9 prone quad stretch    Consulted and Agree with Plan of Care Patient             Patient will benefit from skilled therapeutic intervention in order to improve the following deficits and impairments:  Pain, Decreased strength, Decreased activity tolerance, Difficulty walking, Decreased mobility, Decreased range of motion, Decreased knowledge of precautions, Decreased knowledge of use of DME, Decreased skin integrity  Visit Diagnosis: Chronic  pain of left knee  Muscle weakness (generalized)  Difficulty in walking, not elsewhere classified     Problem List Patient Active Problem List   Diagnosis Date Noted   Derangement of posterior horn of medial meniscus of left knee    S/P left knee  arthroscopy 12/15/20 12/22/2020   9:01 AM, 02/17/21 Georges Lynch PT DPT  Physical Therapist with [place]  [facility]  [phone]  [facility] [address] [place], [place], [address] Phone: [phone]   Fax:  [phone]  Name: Evelyn Moyer MRN: [id number] Date of Birth: [birth year]

## 2021-02-17 NOTE — Patient Instructions (Signed)
Access Code: [email or web address] URL: [email or web address] Date: 02/17/2021 Prepared by: Georges Lynch  Exercises Prone Quadriceps Stretch with Strap - 2 x daily - 7 x weekly - 1 sets - 3-4 reps - 30 second hold

## 2021-02-19 ENCOUNTER — Ambulatory Visit (HOSPITAL_COMMUNITY): Payer: BC Managed Care – PPO

## 2021-02-19 ENCOUNTER — Other Ambulatory Visit: Payer: Self-pay

## 2021-02-19 DIAGNOSIS — G8929 Other chronic pain: Secondary | ICD-10-CM | POA: Diagnosis not present

## 2021-02-19 DIAGNOSIS — R262 Difficulty in walking, not elsewhere classified: Secondary | ICD-10-CM | POA: Diagnosis not present

## 2021-02-19 DIAGNOSIS — M6281 Muscle weakness (generalized): Secondary | ICD-10-CM

## 2021-02-19 DIAGNOSIS — M25562 Pain in left knee: Secondary | ICD-10-CM | POA: Diagnosis not present

## 2021-02-19 NOTE — Therapy (Signed)
[facility] [address] [place], [place], [address] Phone: [phone]   Fax:  [phone]  Physical Therapy Treatment  Patient Details  Name: Evelyn Moyer MRN: [id number] Date of Birth: 10-13-1965 Referring Provider (PT): Fuller Canada   Encounter Date: 02/19/2021   PT End of Session - 02/19/21 0831     Visit Number 13    Number of Visits 18    Date for PT Re-Evaluation 03/02/21    Authorization Type BCBS COMM PPO 30 VL with 0 used, no auth    Authorization - Visit Number 13    Authorization - Number of Visits 30    Progress Note Due on Visit 20    PT Start Time 0818    PT Stop Time 0908    PT Time Calculation (min) 50 min    Activity Tolerance Patient tolerated treatment well    Behavior During Therapy [facility] for tasks assessed/performed             Past Medical History:  Diagnosis Date   Panic attacks     Past Surgical History:  Procedure Laterality Date   APPENDECTOMY     COLONOSCOPY N/A 03/30/2018   Procedure: COLONOSCOPY;  Surgeon: Corbin Ade, MD;  [facility];  Service: Endoscopy;  Laterality: N/A;  12:00   KNEE ARTHROSCOPY WITH MEDIAL MENISECTOMY Left 12/15/2020   Procedure: KNEE ARTHROSCOPY WITH MEDIAL MENISECTOMY;  Surgeon: Vickki Hearing, MD;  Location: [facility];  Service: Orthopedics;  Laterality: Left;   TUBAL LIGATION      There were no vitals filed for this visit.   Subjective Assessment - 02/19/21 0831     Subjective Not very painful, mainly stiffness in left knee. Not feeling very much quad activation in the upper part of the thigh    Pertinent History s/p partial medial meniscectomy debridement lateral meniscus and chondroplasty lateral tibial plateau on left    Currently in Pain? No/denies                [facility] PT Assessment - 02/19/21 0001       Assessment   Medical Diagnosis left knee scope    Referring Provider (PT) Fuller Canada    Onset Date/Surgical Date  12/15/20                           [facility] Adult PT Treatment/Exercise - 02/19/21 0001       Knee/Hip Exercises: Aerobic   Stationary Bike warm up 4 minutes Lv 2 seat position 2      Knee/Hip Exercises: Machines for Strengthening   Cybex Knee Flexion 4 plates, 3J03    Other Machine TKE 3x10 3 plates. Reverse TKE 3x10 3 plates      Electrical Stimulation   Electrical Stimulation Location left quadriceps    Electrical Stimulation Action Russian e-stim for MVIC    Electrical Stimulation Parameters single channel, large pads, 10/50 cycle time, 50 bps, 2 sec ramp x 20 min. Bolster under knee for mid-range position. Therapist cues with current to increase contraction and therapist progressively increasing current from 50 mA to 60 mA by end of session    Electrical Stimulation Goals Strength;Neuromuscular facilitation                       PT Short Term Goals - 02/12/21 0825       PT SHORT TERM GOAL #1   Title  Patient will report at least 50% improvement in overall symptoms and/or function to demonstrate improved functional mobility    Baseline 40% improvement    Time 3    Period Weeks    Status On-going    Target Date 02/23/21      PT SHORT TERM GOAL #2   Title Patient will demonstrate 5-120 degrees of left knee ROM    Baseline 5-110 currently    Time 3    Period Weeks    Status On-going    Target Date 02/09/21      PT SHORT TERM GOAL #3   Title Patient will be independent in self management strategies to improve quality of life and functional outcomes.    Time 3    Period Weeks    Status On-going    Target Date 02/09/21               PT Long Term Goals - 01/19/21 0906       PT LONG TERM GOAL #1   Title Patient will report at least 75% improvement in overall symptoms and/or function to demonstrate improved functional mobility    Time 6    Period Weeks    Status New    Target Date 03/02/21      PT LONG TERM GOAL #2   Title Patient  will be able to walk at least 300 feet without brace or compensatory gait strategies to demonstrate improved functional mobility    Time 6    Period Weeks    Status New    Target Date 03/02/21      PT LONG TERM GOAL #3   Title Patient will meet predicted FOTO score to demonstrate improved overall function.    Time 6    Period Weeks    Status New    Target Date 03/02/21                   Plan - 02/19/21 0853     Clinical Impression Statement Left terminal knee extension remains limited and difficulty in activating left quadriceps proximal > distal. Achieved good tetanic contraction with e-stim today using larger pads that were trimmed to fit LLE. Continued sessions indicated to progress left knee ROM and strength progressing to 75-80% 1RM with PRE.    Examination-Activity Limitations Bend;Squat;Stairs;Stand;Locomotion Level    Examination-Participation Restrictions Yard Work;Community Activity    Stability/Clinical Decision Making Stable/Uncomplicated    Rehab Potential Good    PT Frequency 3x / week    PT Duration 6 weeks    PT Treatment/Interventions ADLs/Self Care Home Management;Cryotherapy;Electrical Stimulation;Moist Heat;Therapeutic exercise;Therapeutic activities;Manual techniques;Stair training;Gait training;DME Instruction;Patient/family education;Neuromuscular re-education;Dry needling;Joint Manipulations    PT Next Visit Plan quad strengthenging, knee ROM. NMES, eccentric quads.    PT Home Exercise Plan calf stretch, heel slides, quad sets 10/17 AAROM heel slide, hamstring strech 10/26 SLR, step up, eccentric chair squats, AAROM heel slides 10/31 heel prop w weight 11/9 prone quad stretch    Consulted and Agree with Plan of Care Patient             Patient will benefit from skilled therapeutic intervention in order to improve the following deficits and impairments:  Pain, Decreased strength, Decreased activity tolerance, Difficulty walking, Decreased mobility,  Decreased range of motion, Decreased knowledge of precautions, Decreased knowledge of use of DME, Decreased skin integrity  Visit Diagnosis: Chronic pain of left knee  Muscle weakness (generalized)  Difficulty in walking, not elsewhere classified  Problem List Patient Active Problem List   Diagnosis Date Noted   Vitamin D deficiency 02/17/2021   Derangement of posterior horn of medial meniscus of left knee    S/P left knee arthroscopy 12/15/20 12/22/2020    Dion Body, PT 02/19/2021, 9:00 AM   [facility] [address] [place], [place], [address] Phone: [phone]   Fax:  [phone]  Name: Evelyn Moyer MRN: [id number] Date of Birth: 03/03/1966

## 2021-02-22 ENCOUNTER — Ambulatory Visit (HOSPITAL_COMMUNITY): Payer: BC Managed Care – PPO

## 2021-02-22 ENCOUNTER — Other Ambulatory Visit: Payer: Self-pay

## 2021-02-22 ENCOUNTER — Encounter: Payer: Self-pay | Admitting: Orthopedic Surgery

## 2021-02-22 ENCOUNTER — Ambulatory Visit (INDEPENDENT_AMBULATORY_CARE_PROVIDER_SITE_OTHER): Payer: BC Managed Care – PPO | Admitting: Orthopedic Surgery

## 2021-02-22 DIAGNOSIS — M6281 Muscle weakness (generalized): Secondary | ICD-10-CM | POA: Diagnosis not present

## 2021-02-22 DIAGNOSIS — M25562 Pain in left knee: Secondary | ICD-10-CM | POA: Diagnosis not present

## 2021-02-22 DIAGNOSIS — R262 Difficulty in walking, not elsewhere classified: Secondary | ICD-10-CM | POA: Diagnosis not present

## 2021-02-22 DIAGNOSIS — Z9889 Other specified postprocedural states: Secondary | ICD-10-CM

## 2021-02-22 DIAGNOSIS — G8929 Other chronic pain: Secondary | ICD-10-CM

## 2021-02-22 NOTE — Therapy (Signed)
[facility] [address] [place], [place], [address] Phone: [phone]   Fax:  [phone]  Physical Therapy Treatment  Patient Details  Name: Evelyn Moyer MRN: [id number] Date of Birth: 06-Jul-1965 Referring Provider (PT): Fuller Canada   Encounter Date: 02/22/2021   PT End of Session - 02/22/21 0822     Visit Number 14    Number of Visits 18    Date for PT Re-Evaluation 03/02/21    Authorization Type BCBS COMM PPO 30 VL with 0 used, no auth    Authorization - Visit Number 14    Authorization - Number of Visits 30    Progress Note Due on Visit 20    PT Start Time [phone]    PT Stop Time 0900    PT Time Calculation (min) 43 min    Activity Tolerance Patient tolerated treatment well    Behavior During Therapy [facility] for tasks assessed/performed             Past Medical History:  Diagnosis Date   Panic attacks     Past Surgical History:  Procedure Laterality Date   APPENDECTOMY     COLONOSCOPY N/A 03/30/2018   Procedure: COLONOSCOPY;  Surgeon: Corbin Ade, MD;  [facility];  Service: Endoscopy;  Laterality: N/A;  12:00   KNEE ARTHROSCOPY WITH MEDIAL MENISECTOMY Left 12/15/2020   Procedure: KNEE ARTHROSCOPY WITH MEDIAL MENISECTOMY;  Surgeon: Vickki Hearing, MD;  Location: [facility];  Service: Orthopedics;  Laterality: Left;   TUBAL LIGATION      There were no vitals filed for this visit.   Subjective Assessment - 02/22/21 0821     Subjective Left knee remains very stiff    Pertinent History s/p partial medial meniscectomy debridement lateral meniscus and chondroplasty lateral tibial plateau on left    Currently in Pain? No/denies                [facility] PT Assessment - 02/22/21 0001       Assessment   Medical Diagnosis left knee scope    Referring Provider (PT) Fuller Canada    Onset Date/Surgical Date 12/15/20                           [facility] Adult PT Treatment/Exercise -  02/22/21 0001       Knee/Hip Exercises: Aerobic   Stationary Bike warm up 4 minutes Lv 2 seat position 2      Knee/Hip Exercises: Standing   Knee Flexion Strengthening;Left;3 sets;10 reps    Knee Flexion Limitations 5#    Hip Flexion Stengthening;Left;3 sets;10 reps    Hip Flexion Limitations 5#    Walking with Sports Cord retro-forward w/ 4 plates x 2 min    Other Standing Knee Exercises sidestepping 5# x 2 min    Other Standing Knee Exercises TKE 3 plates 9I33, retro-TKE 2 plates 8S50 for      Electrical Stimulation   Electrical Stimulation Location left quadriceps    Electrical Stimulation Action russian e-stim left quad 10 on/off 50 bps, mA 50-60 adjusted throughout    Electrical Stimulation Parameters single channel    Electrical Stimulation Goals Strength;Neuromuscular facilitation                       PT Short Term Goals - 02/12/21 0825       PT SHORT TERM GOAL #1   Title Patient will  report at least 50% improvement in overall symptoms and/or function to demonstrate improved functional mobility    Baseline 40% improvement    Time 3    Period Weeks    Status On-going    Target Date 02/23/21      PT SHORT TERM GOAL #2   Title Patient will demonstrate 5-120 degrees of left knee ROM    Baseline 5-110 currently    Time 3    Period Weeks    Status On-going    Target Date 02/09/21      PT SHORT TERM GOAL #3   Title Patient will be independent in self management strategies to improve quality of life and functional outcomes.    Time 3    Period Weeks    Status On-going    Target Date 02/09/21               PT Long Term Goals - 01/19/21 0906       PT LONG TERM GOAL #1   Title Patient will report at least 75% improvement in overall symptoms and/or function to demonstrate improved functional mobility    Time 6    Period Weeks    Status New    Target Date 03/02/21      PT LONG TERM GOAL #2   Title Patient will be able to walk at least 300  feet without brace or compensatory gait strategies to demonstrate improved functional mobility    Time 6    Period Weeks    Status New    Target Date 03/02/21      PT LONG TERM GOAL #3   Title Patient will meet predicted FOTO score to demonstrate improved overall function.    Time 6    Period Weeks    Status New    Target Date 03/02/21                   Plan - 02/22/21 0858     Clinical Impression Statement Continued difficulty with left knee terminal extension lacking approx 5-7 degrees with springy end-feel at end-range. Improving quadricep recruitment noted with increased eccentric control on step-down manuever and good patellar tracking, albeit with some stiffness appreciated in patellofemoral. Pt has f/u with ortho MD this afternoon and hopefully this will illuminate some details.    Examination-Activity Limitations Bend;Squat;Stairs;Stand;Locomotion Level    Examination-Participation Restrictions Yard Work;Community Activity    Stability/Clinical Decision Making Stable/Uncomplicated    Rehab Potential Good    PT Frequency 3x / week    PT Duration 6 weeks    PT Treatment/Interventions ADLs/Self Care Home Management;Cryotherapy;Electrical Stimulation;Moist Heat;Therapeutic exercise;Therapeutic activities;Manual techniques;Stair training;Gait training;DME Instruction;Patient/family education;Neuromuscular re-education;Dry needling;Joint Manipulations    PT Next Visit Plan quad strengthenging, knee ROM. NMES, eccentric quads.    PT Home Exercise Plan calf stretch, heel slides, quad sets 10/17 AAROM heel slide, hamstring strech 10/26 SLR, step up, eccentric chair squats, AAROM heel slides 10/31 heel prop w weight 11/9 prone quad stretch    Consulted and Agree with Plan of Care Patient             Patient will benefit from skilled therapeutic intervention in order to improve the following deficits and impairments:  Pain, Decreased strength, Decreased activity tolerance,  Difficulty walking, Decreased mobility, Decreased range of motion, Decreased knowledge of precautions, Decreased knowledge of use of DME, Decreased skin integrity  Visit Diagnosis: Chronic pain of left knee  Muscle weakness (generalized)  Difficulty in walking, not elsewhere classified  Problem List Patient Active Problem List   Diagnosis Date Noted   Vitamin D deficiency 02/17/2021   Derangement of posterior horn of medial meniscus of left knee    S/P left knee arthroscopy 12/15/20 12/22/2020    Dion Body, PT 02/22/2021, 9:01 AM  [facility] [address] [place], [place], [address] Phone: [phone]   Fax:  [phone]  Name: Evelyn Moyer MRN: [id number] Date of Birth: 12/11/1965

## 2021-02-22 NOTE — Progress Notes (Signed)
Chief Complaint  Patient presents with   Post-op Follow-up    12/15/20 Left knee scope     55 year old female status postarthroscopy left knee and probable fracture left knee  Patient had trouble with her extension and she was doing prone hangs and that is made a significant difference although she is not completely straight she has made much improvement and her pain is very minimal even accounting the soreness she had in both knees yesterday  Her gait is improved she does not have to take any medication for pain  Recommend she continue her therapy follow-up with me in 4 weeks

## 2021-02-24 ENCOUNTER — Other Ambulatory Visit: Payer: Self-pay

## 2021-02-24 ENCOUNTER — Encounter (HOSPITAL_COMMUNITY): Payer: Self-pay | Admitting: Physical Therapy

## 2021-02-24 ENCOUNTER — Ambulatory Visit (HOSPITAL_COMMUNITY): Payer: BC Managed Care – PPO | Admitting: Physical Therapy

## 2021-02-24 DIAGNOSIS — G8929 Other chronic pain: Secondary | ICD-10-CM | POA: Diagnosis not present

## 2021-02-24 DIAGNOSIS — M25562 Pain in left knee: Secondary | ICD-10-CM | POA: Diagnosis not present

## 2021-02-24 DIAGNOSIS — M6281 Muscle weakness (generalized): Secondary | ICD-10-CM | POA: Diagnosis not present

## 2021-02-24 DIAGNOSIS — R262 Difficulty in walking, not elsewhere classified: Secondary | ICD-10-CM

## 2021-02-24 NOTE — Therapy (Signed)
[facility] [address] [place], [place], [address] Phone: [phone]   Fax:  [phone]  Physical Therapy Treatment  Patient Details  Name: Evelyn Moyer MRN: [id number] Date of Birth: Sep 05, 1965 Referring Provider (PT): Fuller Canada   Encounter Date: 02/24/2021   PT End of Session - 02/24/21 0825     Visit Number 15    Number of Visits 18    Date for PT Re-Evaluation 03/02/21    Authorization Type BCBS COMM PPO 30 VL with 0 used, no auth    Authorization - Visit Number 15    Authorization - Number of Visits 30    Progress Note Due on Visit 20    PT Start Time 0819    PT Stop Time 0903    PT Time Calculation (min) 44 min    Activity Tolerance Patient tolerated treatment well    Behavior During Therapy [facility] for tasks assessed/performed             Past Medical History:  Diagnosis Date   Panic attacks     Past Surgical History:  Procedure Laterality Date   APPENDECTOMY     COLONOSCOPY N/A 03/30/2018   Procedure: COLONOSCOPY;  Surgeon: Corbin Ade, MD;  [facility];  Service: Endoscopy;  Laterality: N/A;  12:00   KNEE ARTHROSCOPY WITH MEDIAL MENISECTOMY Left 12/15/2020   Procedure: KNEE ARTHROSCOPY WITH MEDIAL MENISECTOMY;  Surgeon: Vickki Hearing, MD;  Location: [facility];  Service: Orthopedics;  Laterality: Left;   TUBAL LIGATION      There were no vitals filed for this visit.   Subjective Assessment - 02/24/21 0824     Subjective Patient says she is doing well. No new issues. Exercises are going well. No pain this morning.    Pertinent History s/p partial medial meniscectomy debridement lateral meniscus and chondroplasty lateral tibial plateau on left    Currently in Pain? No/denies                               [facility] Adult PT Treatment/Exercise - 02/24/21 0001       Knee/Hip Exercises: Machines for Strengthening   Other Machine TKE 15x3" hold 4pl      Knee/Hip  Exercises: Standing   Forward Step Up Left;2 sets;10 reps;Hand Hold: 1;Step Height: 6"    Step Down Left;2 sets;10 reps;Step Height: 4";Hand Hold: 1    Step Down Limitations eccentric lowering    Functional Squat 2 sets;10 reps    Functional Squat Limitations to chair for depth cue, cues for even weight shift      Knee/Hip Exercises: Supine   Quad Sets --    Quad Sets Limitations 5" hold with e-stim    Short Arc Quad Sets Left;1 set;10 reps    Short Arc Quad Sets Limitations 5" hold with e-stim    Straight Leg Raises Left;1 set;10 reps    Other Supine Knee/Hip Exercises heel prop 3lb 2 minute      Electrical Stimulation   Electrical Stimulation Location left quadriceps    Electrical Stimulation Action russian e-stim    Electrical Stimulation Parameters 5/5 sec, 50% duty cycle, up tp 36 mA, 8 min    Electrical Stimulation Goals Strength;Neuromuscular facilitation                       PT Short Term Goals - 02/12/21 0825  PT SHORT TERM GOAL #1   Title Patient will report at least 50% improvement in overall symptoms and/or function to demonstrate improved functional mobility    Baseline 40% improvement    Time 3    Period Weeks    Status On-going    Target Date 02/23/21      PT SHORT TERM GOAL #2   Title Patient will demonstrate 5-120 degrees of left knee ROM    Baseline 5-110 currently    Time 3    Period Weeks    Status On-going    Target Date 02/09/21      PT SHORT TERM GOAL #3   Title Patient will be independent in self management strategies to improve quality of life and functional outcomes.    Time 3    Period Weeks    Status On-going    Target Date 02/09/21               PT Long Term Goals - 01/19/21 0906       PT LONG TERM GOAL #1   Title Patient will report at least 75% improvement in overall symptoms and/or function to demonstrate improved functional mobility    Time 6    Period Weeks    Status New    Target Date 03/02/21       PT LONG TERM GOAL #2   Title Patient will be able to walk at least 300 feet without brace or compensatory gait strategies to demonstrate improved functional mobility    Time 6    Period Weeks    Status New    Target Date 03/02/21      PT LONG TERM GOAL #3   Title Patient will meet predicted FOTO score to demonstrate improved overall function.    Time 6    Period Weeks    Status New    Target Date 03/02/21                   Plan - 02/24/21 0856     Clinical Impression Statement Patient with improved functional ability and strength. Continues to be limited by anterior knee pain with full passive knee extensions. Still has about a 5 degree lack in full extension. She does have improved quad activation and demos no quad lag with SLR. Will continue to benefit from functional strength progressions for improved functional mobility and normalized gait mechanics.    Examination-Activity Limitations Bend;Squat;Stairs;Stand;Locomotion Level    Examination-Participation Restrictions Yard Work;Community Activity    Stability/Clinical Decision Making Stable/Uncomplicated    Rehab Potential Good    PT Frequency 3x / week    PT Duration 6 weeks    PT Treatment/Interventions ADLs/Self Care Home Management;Cryotherapy;Electrical Stimulation;Moist Heat;Therapeutic exercise;Therapeutic activities;Manual techniques;Stair training;Gait training;DME Instruction;Patient/family education;Neuromuscular re-education;Dry needling;Joint Manipulations    PT Next Visit Plan quad strengthenging, knee ROM. NMES, eccentric quads.    PT Home Exercise Plan calf stretch, heel slides, quad sets 10/17 AAROM heel slide, hamstring strech 10/26 SLR, step up, eccentric chair squats, AAROM heel slides 10/31 heel prop w weight 11/9 prone quad stretch    Consulted and Agree with Plan of Care Patient             Patient will benefit from skilled therapeutic intervention in order to improve the following deficits and  impairments:  Pain, Decreased strength, Decreased activity tolerance, Difficulty walking, Decreased mobility, Decreased range of motion, Decreased knowledge of precautions, Decreased knowledge of use of DME, Decreased skin integrity  Visit Diagnosis:  Chronic pain of left knee  Difficulty in walking, not elsewhere classified  Muscle weakness (generalized)     Problem List Patient Active Problem List   Diagnosis Date Noted   Vitamin D deficiency 02/17/2021   Derangement of posterior horn of medial meniscus of left knee    S/P left knee arthroscopy 12/15/20 12/22/2020   9:06 AM, 02/24/21 Georges Lynch PT DPT  Physical Therapist with [place]  [facility]  [phone]  [facility] [address] [place], [place], [address] Phone: [phone]   Fax:  [phone]  Name: Evelyn Moyer MRN: [id number] Date of Birth: 1966-01-31

## 2021-02-26 ENCOUNTER — Other Ambulatory Visit: Payer: Self-pay

## 2021-02-26 ENCOUNTER — Encounter (HOSPITAL_COMMUNITY): Payer: Self-pay | Admitting: Physical Therapy

## 2021-02-26 ENCOUNTER — Ambulatory Visit (HOSPITAL_COMMUNITY): Payer: BC Managed Care – PPO | Admitting: Physical Therapy

## 2021-02-26 DIAGNOSIS — M25562 Pain in left knee: Secondary | ICD-10-CM

## 2021-02-26 DIAGNOSIS — G8929 Other chronic pain: Secondary | ICD-10-CM

## 2021-02-26 DIAGNOSIS — R262 Difficulty in walking, not elsewhere classified: Secondary | ICD-10-CM | POA: Diagnosis not present

## 2021-02-26 DIAGNOSIS — M6281 Muscle weakness (generalized): Secondary | ICD-10-CM | POA: Diagnosis not present

## 2021-02-26 NOTE — Therapy (Signed)
[facility] [address] [place], [place], [address] Phone: [phone]   Fax:  [phone]  Physical Therapy Treatment and Progress Note  Patient Details  Name: Evelyn Moyer MRN: [id number] Date of Birth: 07/13/1965 Referring Provider (PT): Fuller Canada  Progress Note Reporting Period 02/12/21 to 02/26/21  See note below for Objective Data and Assessment of Progress/Goals.      Encounter Date: 02/26/2021   PT End of Session - 02/26/21 0747     Visit Number 16    Number of Visits 18    Date for PT Re-Evaluation 03/02/21    Authorization Type BCBS COMM PPO 30 VL with 0 used, no auth    Authorization - Visit Number 16    Authorization - Number of Visits 30    Progress Note Due on Visit 20    PT Start Time 0747    PT Stop Time 0826    PT Time Calculation (min) 39 min    Activity Tolerance Patient tolerated treatment well    Behavior During Therapy [facility] for tasks assessed/performed             Past Medical History:  Diagnosis Date   Panic attacks     Past Surgical History:  Procedure Laterality Date   APPENDECTOMY     COLONOSCOPY N/A 03/30/2018   Procedure: COLONOSCOPY;  Surgeon: Corbin Ade, MD;  [facility];  Service: Endoscopy;  Laterality: N/A;  12:00   KNEE ARTHROSCOPY WITH MEDIAL MENISECTOMY Left 12/15/2020   Procedure: KNEE ARTHROSCOPY WITH MEDIAL MENISECTOMY;  Surgeon: Vickki Hearing, MD;  Location: [facility];  Service: Orthopedics;  Laterality: Left;   TUBAL LIGATION      There were no vitals filed for this visit.   Subjective Assessment - 02/26/21 0751     Subjective Reports no pain, states a little stiff but her knee is always a little stiff in the AM.    Pertinent History s/p partial medial meniscectomy debridement lateral meniscus and chondroplasty lateral tibial plateau on left    Currently in Pain? No/denies                [facility] PT Assessment - 02/26/21 0001        Assessment   Medical Diagnosis left knee scope    Referring Provider (PT) Fuller Canada    Onset Date/Surgical Date 12/15/20      Observation/Other Assessments   Focus on Therapeutic Outcomes (FOTO)  63% function, predicted 79% function                           [facility] Adult PT Treatment/Exercise - 02/26/21 0001       Knee/Hip Exercises: Aerobic   Elliptical 4 minutes warm up      Knee/Hip Exercises: Standing   Step Down Left;3 sets;10 reps;Hand Hold: 1;Step Height: 6"   slow and controlled - eccentric     Knee/Hip Exercises: Supine   Knee Extension AROM;Left    Knee Extension Limitations 5   lacking   Knee Flexion AROM;Left    Knee Flexion Limitations 120    Other Supine Knee/Hip Exercises self massage and patelllar mobilizations with PT instructions and prior demonstration 10 minutes      Knee/Hip Exercises: Prone   Other Prone Exercises quadruped opsiitoning for increased weight bearing on Left knee - 5 minutes total  PT Education - 02/26/21 0819     Education Details on FOTO score, on HEP on POC.    Person(s) Educated Patient    Methods Explanation    Comprehension Verbalized understanding              PT Short Term Goals - 02/26/21 0753       PT SHORT TERM GOAL #1   Title Patient will report at least 50% improvement in overall symptoms and/or function to demonstrate improved functional mobility    Baseline 60% improvement    Time 3    Period Weeks    Status Achieved    Target Date 02/23/21      PT SHORT TERM GOAL #2   Title Patient will demonstrate 5-120 degrees of left knee ROM    Baseline 5-110 currently    Time 3    Period Weeks    Status Achieved    Target Date 02/09/21      PT SHORT TERM GOAL #3   Title Patient will be independent in self management strategies to improve quality of life and functional outcomes.    Time 3    Period Weeks    Status Achieved    Target Date 02/09/21                PT Long Term Goals - 02/26/21 0753       PT LONG TERM GOAL #1   Title Patient will report at least 75% improvement in overall symptoms and/or function to demonstrate improved functional mobility    Baseline 60% better    Time 6    Period Weeks    Status On-going      PT LONG TERM GOAL #2   Title Patient will be able to walk at least 300 feet without brace or compensatory gait strategies to demonstrate improved functional mobility    Time 6    Period Weeks    Status Achieved      PT LONG TERM GOAL #3   Title Patient will meet predicted FOTO score to demonstrate improved overall function.    Time 6    Period Weeks    Status On-going                   Plan - 02/26/21 0859     Clinical Impression Statement Patient progressing towards goals but lacks of functional TKE still most limiting at this time. Patient's knee extension is still about the same as it was about a month ago. Discussed current presentation and plan moving forward and patient will finish up current POC and work towards transitioning to home program. Instructed patient to bring any functional tasks that are still challenging for her to go over next session.    Examination-Activity Limitations Bend;Squat;Stairs;Stand;Locomotion Level    Examination-Participation Restrictions Yard Work;Community Activity    Stability/Clinical Decision Making Stable/Uncomplicated    Rehab Potential Good    PT Frequency 3x / week    PT Duration 6 weeks    PT Treatment/Interventions ADLs/Self Care Home Management;Cryotherapy;Electrical Stimulation;Moist Heat;Therapeutic exercise;Therapeutic activities;Manual techniques;Stair training;Gait training;DME Instruction;Patient/family education;Neuromuscular re-education;Dry needling;Joint Manipulations    PT Next Visit Plan anticipate DC to HEP- continue TKE stretches/exercises - review HEP    PT Home Exercise Plan calf stretch, heel slides, quad sets 10/17 AAROM heel slide,  hamstring strech 10/26 SLR, step up, eccentric chair squats, AAROM heel slides 10/31 heel prop w weight 11/9 prone quad stretch    Consulted and Agree with Plan  of Care Patient             Patient will benefit from skilled therapeutic intervention in order to improve the following deficits and impairments:  Pain, Decreased strength, Decreased activity tolerance, Difficulty walking, Decreased mobility, Decreased range of motion, Decreased knowledge of precautions, Decreased knowledge of use of DME, Decreased skin integrity  Visit Diagnosis: Chronic pain of left knee  Difficulty in walking, not elsewhere classified  Muscle weakness (generalized)     Problem List Patient Active Problem List   Diagnosis Date Noted   Vitamin D deficiency 02/17/2021   Derangement of posterior horn of medial meniscus of left knee    S/P left knee arthroscopy 12/15/20 12/22/2020  9:08 AM, 02/26/21 Tereasa Coop, DPT Physical Therapy with [facility]  [phone] office   [facility] [address] [place], [place], [address] Phone: [phone]   Fax:  [phone]  Name: Evelyn Moyer MRN: [id number] Date of Birth: 1966/04/11

## 2021-03-01 ENCOUNTER — Encounter (HOSPITAL_COMMUNITY): Payer: Self-pay | Admitting: Physical Therapy

## 2021-03-01 ENCOUNTER — Ambulatory Visit (HOSPITAL_COMMUNITY): Payer: BC Managed Care – PPO | Admitting: Physical Therapy

## 2021-03-01 ENCOUNTER — Other Ambulatory Visit: Payer: Self-pay

## 2021-03-01 DIAGNOSIS — M25562 Pain in left knee: Secondary | ICD-10-CM

## 2021-03-01 DIAGNOSIS — G8929 Other chronic pain: Secondary | ICD-10-CM

## 2021-03-01 DIAGNOSIS — R262 Difficulty in walking, not elsewhere classified: Secondary | ICD-10-CM | POA: Diagnosis not present

## 2021-03-01 DIAGNOSIS — M6281 Muscle weakness (generalized): Secondary | ICD-10-CM | POA: Diagnosis not present

## 2021-03-01 NOTE — Patient Instructions (Signed)
Access Code: [email or web address] URL: [email or web address] Date: 03/01/2021 Prepared by: Georges Lynch  Exercises Gastroc Stretch on Wall - 1 x daily - 3-4 x weekly - 1 sets - 3 reps - 30 second hold Hip Abduction with Resistance Loop - 1 x daily - 3-4 x weekly - 2 sets - 10 reps Hip Extension with Resistance Loop - 1 x daily - 3-4 x weekly - 2 sets - 10 reps Eccentric Squat - 1 x daily - 3-4 x weekly - 2 sets - 10 reps - 3 second hold Standing Terminal Knee Extension with Resistance - 1 x daily - 3-4 x weekly - 2 sets - 10 reps - 3 second hold Chair Pose - 1 x daily - 3-4 x weekly - 1 sets - 5 reps - 20-30 second hold Forward Step Down Touch with Heel - 1 x daily - 3-4 x weekly - 2 sets - 10 reps

## 2021-03-01 NOTE — Therapy (Signed)
[place], [place], [address] Phone: [phone]   Fax:  [phone]  Physical Therapy Treatment  Patient Details  Name: Evelyn Moyer MRN: [id number] Date of Birth: 1965/10/12 Referring Provider (PT): Arther Abbott  PHYSICAL THERAPY DISCHARGE SUMMARY  Visits from [facility] of Care: 17  Current functional level related to goals / functional outcomes: See PN dated 11/18   Remaining deficits: See PN dated 11/18   Education / Equipment: See below   Patient agrees to discharge. Patient goals were partially met. Patient is being discharged due to being pleased with the current functional level.   Encounter Date: 03/01/2021   PT End of Session - 03/01/21 0826     Visit Number 17    Number of Visits 18    Date for PT Re-Evaluation 03/02/21    Authorization Type BCBS COMM PPO 30 VL with 0 used, no auth    Authorization - Visit Number 17    Authorization - Number of Visits 30    Progress Note Due on Visit 20    PT Start Time 0822    PT Stop Time 0900    PT Time Calculation (min) 38 min    Activity Tolerance Patient tolerated treatment well    Behavior During Therapy WFL for tasks assessed/performed             Past Medical History:  Diagnosis Date   Panic attacks     Past Surgical History:  Procedure Laterality Date   APPENDECTOMY     COLONOSCOPY N/A 03/30/2018   Procedure: COLONOSCOPY;  Surgeon: Daneil Dolin, MD;  [facility];  Service: Endoscopy;  Laterality: N/A;  12:00   KNEE ARTHROSCOPY WITH MEDIAL MENISECTOMY Left 12/15/2020   Procedure: KNEE ARTHROSCOPY WITH MEDIAL MENISECTOMY;  Surgeon: Carole Civil, MD;  Location: [facility];  Service: Orthopedics;  Laterality: Left;   TUBAL LIGATION      There were no vitals filed for this visit.   Subjective Assessment - 03/01/21 0825     Subjective Patient says she is doing well, no new issues. She is ready for DC today.     Pertinent History s/p partial medial meniscectomy debridement lateral meniscus and chondroplasty lateral tibial plateau on left    Limitations Walking;Standing    Patient Stated Goals to get back to walking, yoga and weight lifting    Currently in Pain? No/denies                [facility] PT Assessment - 03/01/21 0001       AROM   Left Knee Extension 5    Left Knee Flexion 120                           [facility] Adult PT Treatment/Exercise - 03/01/21 0001       Knee/Hip Exercises: Standing   Hip Abduction Both;2 sets;10 reps    Abduction Limitations GTB    Hip Extension Both;2 sets;10 reps    Extension Limitations GTB    Step Down Left;2 sets;10 reps;Hand Hold: 1;Step Height: 4";Step Height: 6"   1st set 4 inch, 2nd set 6 inch   Functional Squat 1 set;15 reps   eccentric lowering to low mat                    PT Education - 03/01/21 0855     Education Details HEP  Person(s) Educated Patient    Methods Explanation;Handout    Comprehension Verbalized understanding              PT Short Term Goals - 02/26/21 0753       PT SHORT TERM GOAL #1   Title Patient will report at least 50% improvement in overall symptoms and/or function to demonstrate improved functional mobility    Baseline 60% improvement    Time 3    Period Weeks    Status Achieved    Target Date 02/23/21      PT SHORT TERM GOAL #2   Title Patient will demonstrate 5-120 degrees of left knee ROM    Baseline 5-110 currently    Time 3    Period Weeks    Status Achieved    Target Date 02/09/21      PT SHORT TERM GOAL #3   Title Patient will be independent in self management strategies to improve quality of life and functional outcomes.    Time 3    Period Weeks    Status Achieved    Target Date 02/09/21               PT Long Term Goals - 03/01/21 0856       PT LONG TERM GOAL #1   Title Patient will report at least 75% improvement in overall symptoms and/or  function to demonstrate improved functional mobility    Baseline 70% better    Time 6    Period Weeks    Status Not Met      PT LONG TERM GOAL #2   Title Patient will be able to walk at least 300 feet without brace or compensatory gait strategies to demonstrate improved functional mobility    Time 6    Period Weeks    Status Achieved      PT LONG TERM GOAL #3   Title Patient will meet predicted FOTO score to demonstrate improved overall function.    Time 6    Period Weeks    Status Not Met                   Plan - 03/01/21 0857     Clinical Impression Statement Reviewed comprehensive HEP and answered all patient questions. Patient being DC today, issued updated handout. Encouraged patient to follow up with therapy services with any further questions or concerns.    Examination-Activity Limitations Bend;Squat;Stairs;Stand;Locomotion Level    Examination-Participation Restrictions Yard Work;Community Activity    Stability/Clinical Decision Making Stable/Uncomplicated    Rehab Potential Good    PT Treatment/Interventions ADLs/Self Care Home Management;Cryotherapy;Electrical Stimulation;Moist Heat;Therapeutic exercise;Therapeutic activities;Manual techniques;Stair training;Gait training;DME Instruction;Patient/family education;Neuromuscular re-education;Dry needling;Joint Manipulations    PT Next Visit Plan DC to HEP    PT Home Exercise Plan calf stretch, heel slides, quad sets 10/17 AAROM heel slide, hamstring strech 10/26 SLR, step up, eccentric chair squats, AAROM heel slides 10/31 heel prop w weight 11/9 prone quad stretch    Consulted and Agree with Plan of Care Patient             Patient will benefit from skilled therapeutic intervention in order to improve the following deficits and impairments:  Pain, Decreased strength, Decreased activity tolerance, Difficulty walking, Decreased mobility, Decreased range of motion, Decreased knowledge of precautions, Decreased  knowledge of use of DME, Decreased skin integrity  Visit Diagnosis: Chronic pain of left knee  Difficulty in walking, not elsewhere classified  Muscle weakness (generalized)  Problem List Patient Active Problem List   Diagnosis Date Noted   Vitamin D deficiency 02/17/2021   Derangement of posterior horn of medial meniscus of left knee    S/P left knee arthroscopy 12/15/20 12/22/2020   8:59 AM, 03/01/21 Josue Hector PT DPT  Physical Therapist with [facility]  [phone] [place], [place], [address] Phone: [phone]   Fax:  [phone]  Name: Evelyn Moyer MRN: [id number] Date of Birth: Jan 01, 1966

## 2021-03-03 ENCOUNTER — Encounter (HOSPITAL_COMMUNITY): Payer: BC Managed Care – PPO | Admitting: Physical Therapy

## 2021-03-08 ENCOUNTER — Encounter (HOSPITAL_COMMUNITY): Payer: BC Managed Care – PPO | Admitting: Physical Therapy

## 2021-03-10 ENCOUNTER — Encounter (HOSPITAL_COMMUNITY): Payer: BC Managed Care – PPO

## 2021-03-12 ENCOUNTER — Encounter (HOSPITAL_COMMUNITY): Payer: BC Managed Care – PPO | Admitting: Physical Therapy

## 2021-03-22 ENCOUNTER — Encounter: Payer: BC Managed Care – PPO | Admitting: Orthopedic Surgery

## 2021-04-01 ENCOUNTER — Ambulatory Visit (INDEPENDENT_AMBULATORY_CARE_PROVIDER_SITE_OTHER): Payer: BC Managed Care – PPO | Admitting: Orthopedic Surgery

## 2021-04-01 ENCOUNTER — Encounter: Payer: Self-pay | Admitting: Orthopedic Surgery

## 2021-04-01 ENCOUNTER — Other Ambulatory Visit: Payer: Self-pay

## 2021-04-01 VITALS — BP 140/65 | HR 70 | Ht 61.0 in | Wt 117.0 lb

## 2021-04-01 DIAGNOSIS — M1712 Unilateral primary osteoarthritis, left knee: Secondary | ICD-10-CM

## 2021-04-01 DIAGNOSIS — S83242D Other tear of medial meniscus, current injury, left knee, subsequent encounter: Secondary | ICD-10-CM

## 2021-04-01 DIAGNOSIS — M1710 Unilateral primary osteoarthritis, unspecified knee: Secondary | ICD-10-CM

## 2021-04-01 NOTE — Progress Notes (Signed)
Chief Complaint  Patient presents with   s/p left knee arthroscopy    DOS 12/15/20    Encounter Diagnoses  Name Primary?   Primary localized osteoarthritis of knee left knee Yes   Acute medial meniscus tear of left knee, subsequent encounter    Evelyn Moyer is doing well, she was not able to regain full extension  Her arthroscopy revealed grade 3 tibial plateau and grade 4 medial femoral condyle arthritis in the medial compartment with a lateral chondral flap on the tibia full-thickness grade 4 along with the free edge tear of the medial meniscus and posterior horn root free edge tear of the lateral meniscus not to be left out there was patellofemoral grade 4 trochlear lesion and grade 3 medial patellar lesion  There was also suggestion of tibial trauma with hemorrhage and old clot in the notch area  But fortunately she is happy with her knee she is regained most of her function does well without any major problems  We discussed using chondroitin sulfate and glucosamine and if she has further trouble in the knee which I told her to expect at some point we can go to hyaluronic acid injection

## 2021-08-03 DIAGNOSIS — H0014 Chalazion left upper eyelid: Secondary | ICD-10-CM | POA: Diagnosis not present

## 2021-12-31 DIAGNOSIS — M25562 Pain in left knee: Secondary | ICD-10-CM | POA: Diagnosis not present

## 2021-12-31 DIAGNOSIS — M1712 Unilateral primary osteoarthritis, left knee: Secondary | ICD-10-CM | POA: Diagnosis not present

## 2022-01-24 ENCOUNTER — Ambulatory Visit
Admission: EM | Admit: 2022-01-24 | Discharge: 2022-01-24 | Disposition: A | Discharge status: Home / Self Care | Payer: BC Managed Care – PPO

## 2022-01-24 ENCOUNTER — Encounter: Payer: Self-pay | Admitting: Emergency Medicine

## 2022-01-24 DIAGNOSIS — R030 Elevated blood-pressure reading, without diagnosis of hypertension: Secondary | ICD-10-CM | POA: Diagnosis not present

## 2022-01-24 DIAGNOSIS — Z1231 Encounter for screening mammogram for malignant neoplasm of breast: Secondary | ICD-10-CM | POA: Diagnosis not present

## 2022-01-24 DIAGNOSIS — A6000 Herpesviral infection of urogenital system, unspecified: Secondary | ICD-10-CM | POA: Diagnosis not present

## 2022-01-24 DIAGNOSIS — Z760 Encounter for issue of repeat prescription: Secondary | ICD-10-CM | POA: Diagnosis not present

## 2022-01-24 DIAGNOSIS — Z01419 Encounter for gynecological examination (general) (routine) without abnormal findings: Secondary | ICD-10-CM | POA: Diagnosis not present

## 2022-01-24 LAB — HM MAMMOGRAPHY

## 2022-01-24 NOTE — ED Triage Notes (Signed)
Pt reports HTN. States she was at her OB-GYN appointment this evening and was told to come to urgent care for elevated blood pressure. Reports BP was 171/82 and then 159/81 before leaving the appointment. Does have a PCP and reports not having a dx of HTN.

## 2022-01-24 NOTE — Discharge Instructions (Signed)
Recommend logging your home blood pressure readings, eating a low-sodium diet and staying active.  Follow-up with your primary care provider within the next week to recheck your readings and discuss if medication is appropriate

## 2022-01-28 NOTE — ED Provider Notes (Signed)
[facility]    CSN: [id number] Arrival date & time: 01/24/22  1707      History   Chief Complaint Chief Complaint  Patient presents with   Hypertension    HPI Evelyn Moyer is a 56 y.o. female.   Presenting today concerned regarding elevated blood pressure readings. States she was at her OBGYN earlier today and her BP initially was 171/82 and on recheck 159/81. It was recommended she come to [facility] for further evaluation. Denies CP, SOB, palpitations, HAs, dizziness. States her BP was a bit elevated at her CPE last year but it was determined by PCP to watch things for now. She used to monitor home BPs occasionally but stopped months ago. Eats a healthy diet and stays very active.     Past Medical History:  Diagnosis Date   Panic attacks     Patient Active Problem List   Diagnosis Date Noted   Vitamin D deficiency 02/17/2021   Derangement of posterior horn of medial meniscus of left knee    S/P left knee arthroscopy 12/15/20 12/22/2020    Past Surgical History:  Procedure Laterality Date   APPENDECTOMY     COLONOSCOPY N/A 03/30/2018   Procedure: COLONOSCOPY;  Surgeon: Corbin Ade, MD;  [facility];  Service: Endoscopy;  Laterality: N/A;  12:00   KNEE ARTHROSCOPY WITH MEDIAL MENISECTOMY Left 12/15/2020   Procedure: KNEE ARTHROSCOPY WITH MEDIAL MENISECTOMY;  Surgeon: Vickki Hearing, MD;  Location: [facility];  Service: Orthopedics;  Laterality: Left;   TUBAL LIGATION      OB History   No obstetric history on file.      Home Medications    Prior to Admission medications   Medication Sig Start Date End Date Taking? Authorizing Provider  APPLE CIDER VINEGAR PO Take 2 tablets by mouth daily.    [provider]  BLACK ELDERBERRY PO Take 1 tablet by mouth daily.    [provider]  glucosamine-chondroitin 500-400 MG tablet Take 2 tablets by mouth daily.    [provider]  Multiple Vitamin (MULTIVITAMIN WITH MINERALS)  TABS tablet Take 1 tablet by mouth daily.    [provider]  Multiple Vitamins-Minerals (HAIR SKIN AND NAILS FORMULA PO) Take 2 tablets by mouth daily.    [provider]  Probiotic Product (PROBIOTIC DAILY PO) Take 1 capsule by mouth daily.     [provider]  TURMERIC PO Take 1 capsule by mouth daily.    [provider]  valACYclovir (VALTREX) 500 MG tablet Take 500 mg by mouth daily as needed (outbreak).     [provider]    Family History Family History  Problem Relation Age of Onset   Hypertension Mother    Diabetes Mother     Social History Social History   Tobacco Use   Smoking status: Never   Smokeless tobacco: Never  Vaping Use   Vaping Use: Never used  Substance Use Topics   Alcohol use: Yes    Alcohol/week: 1.0 standard drink of alcohol    Types: 1 Glasses of wine per week   Drug use: Never     Allergies   Patient has no known allergies.   Review of Systems Review of Systems PER HPI  Physical Exam Triage Vital Signs ED Triage Vitals  Enc Vitals Group     BP 01/24/22 1715 (!) 144/76     Pulse Rate 01/24/22 1715 62     Resp 01/24/22 1715 18  Temp 01/24/22 1715 98.6 F (37 C)     Temp Source 01/24/22 1715 Oral     SpO2 01/24/22 1715 98 %     Weight --      Height --      Head Circumference --      Peak Flow --      Pain Score 01/24/22 1716 0     Pain Loc --      Pain Edu? --      Excl. in GC? --    No data found.  Updated Vital Signs BP (!) 144/76 (BP Location: Right Arm)   Pulse 62   Temp 98.6 F (37 C) (Oral)   Resp 18   SpO2 98%   Visual Acuity Right Eye Distance:   Left Eye Distance:   Bilateral Distance:    Right Eye Near:   Left Eye Near:    Bilateral Near:     Physical Exam Vitals and nursing note reviewed.  Constitutional:      Appearance: Normal appearance. She is not ill-appearing.  HENT:     Head: Atraumatic.  Eyes:     Extraocular Movements: Extraocular movements  intact.     Conjunctiva/sclera: Conjunctivae normal.  Cardiovascular:     Rate and Rhythm: Normal rate and regular rhythm.     Heart sounds: Normal heart sounds.  Pulmonary:     Effort: Pulmonary effort is normal.     Breath sounds: Normal breath sounds.  Musculoskeletal:        General: Normal range of motion.     Cervical back: Normal range of motion and neck supple.  Skin:    General: Skin is warm and dry.  Neurological:     Mental Status: She is alert and oriented to person, place, and time.  Psychiatric:        Mood and Affect: Mood normal.        Thought Content: Thought content normal.        Judgment: Judgment normal.      UC Treatments / Results  Labs (all labs ordered are listed, but only abnormal results are displayed) Labs Reviewed - No data to display  EKG   Radiology No results found.  Procedures Procedures (including critical care time)  Medications Ordered in UC Medications - No data to display  Initial Impression / Assessment and Plan / UC Course  I have reviewed the triage vital signs and the nursing notes.  Pertinent labs & imaging results that were available during my care of the patient were reviewed by me and considered in my medical decision making (see chart for details).     BP continues to be a bit elevated here today but she is well appearing with no concerning associated sxs. Do suspect based on the trend that she has HTN, but discussed with patient that this is not a diagnosis based on a signular day of elevated readings. Discussed to start DASH diet, continue exercise and stress reduction and monitor and log home readings daily. She is to f/u with PCP over next week or so for recheck and further management if continuing to be elevated consistently. Return for worsening sxs.  Final Clinical Impressions(s) / UC Diagnoses   Final diagnoses:  Elevated blood pressure reading     Discharge Instructions      Recommend logging your home  blood pressure readings, eating a low-sodium diet and staying active.  Follow-up with your primary care provider within the next week to recheck  your readings and discuss if medication is appropriate    ED Prescriptions   None    PDMP not reviewed this encounter.   Merrie Roof [place], [place] 01/28/22 [phone]

## 2022-01-31 DIAGNOSIS — R030 Elevated blood-pressure reading, without diagnosis of hypertension: Secondary | ICD-10-CM | POA: Diagnosis not present

## 2022-03-02 DIAGNOSIS — M25562 Pain in left knee: Secondary | ICD-10-CM | POA: Diagnosis not present

## 2022-03-02 DIAGNOSIS — M1712 Unilateral primary osteoarthritis, left knee: Secondary | ICD-10-CM | POA: Diagnosis not present

## 2022-06-09 ENCOUNTER — Encounter: Payer: Self-pay | Admitting: Radiology

## 2022-08-01 DIAGNOSIS — I10 Essential (primary) hypertension: Secondary | ICD-10-CM | POA: Diagnosis not present

## 2022-08-03 IMAGING — DX DG KNEE COMPLETE 4+V*L*
4 series · 4 of 4 positions shown · non-contrast
Comparison: None.

CLINICAL DATA: Knee pain for 2 years

EXAM:
LEFT KNEE - COMPLETE 4+ VIEW

[knee ap]
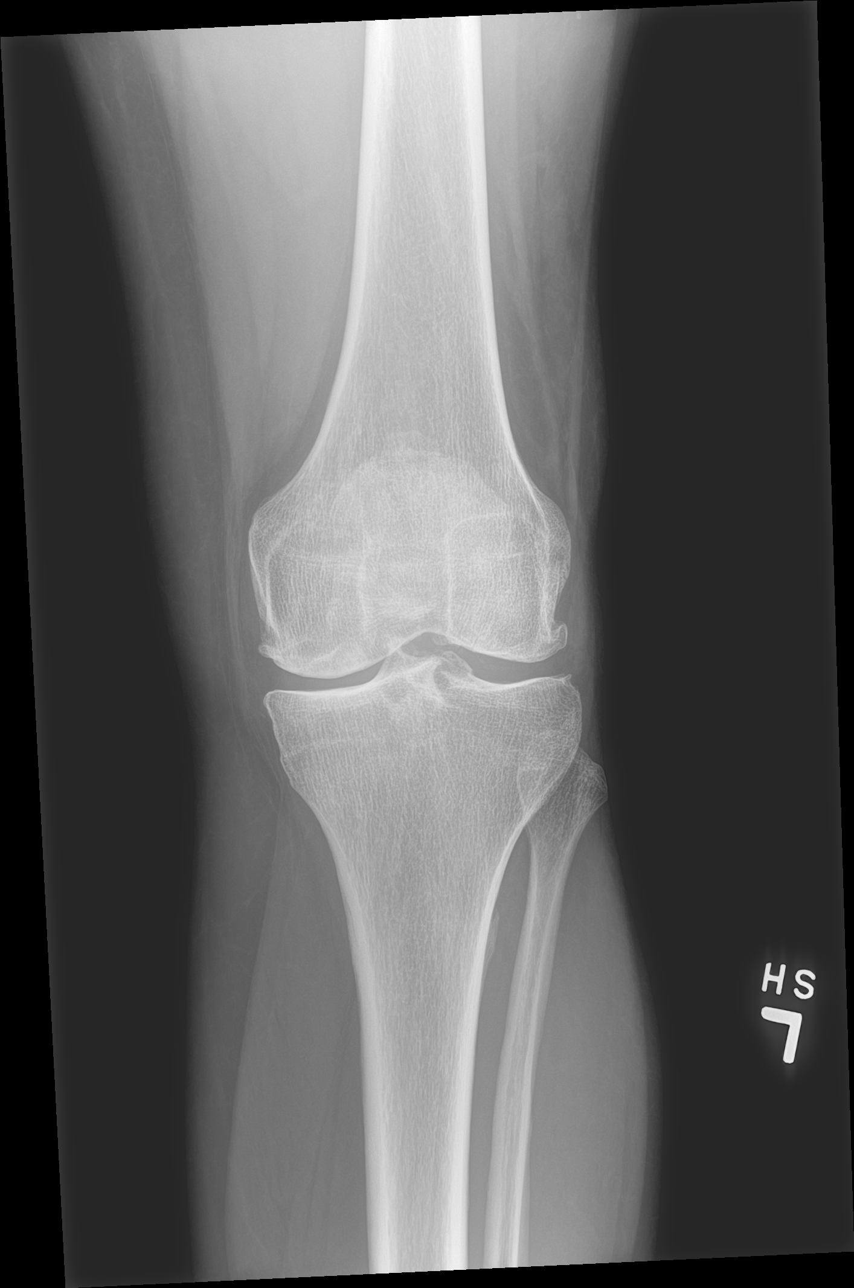

[knee obl (1 of 2)]
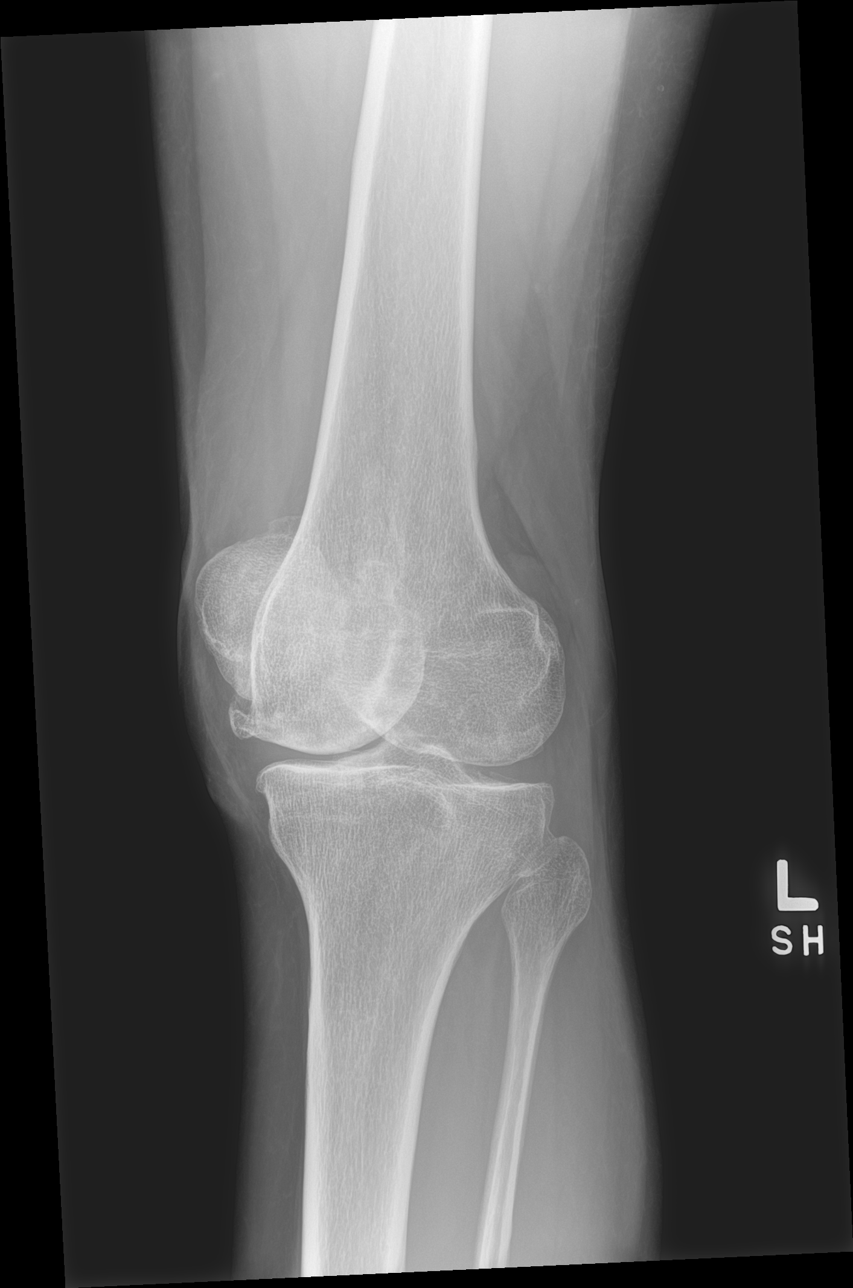

[knee obl (2 of 2)]
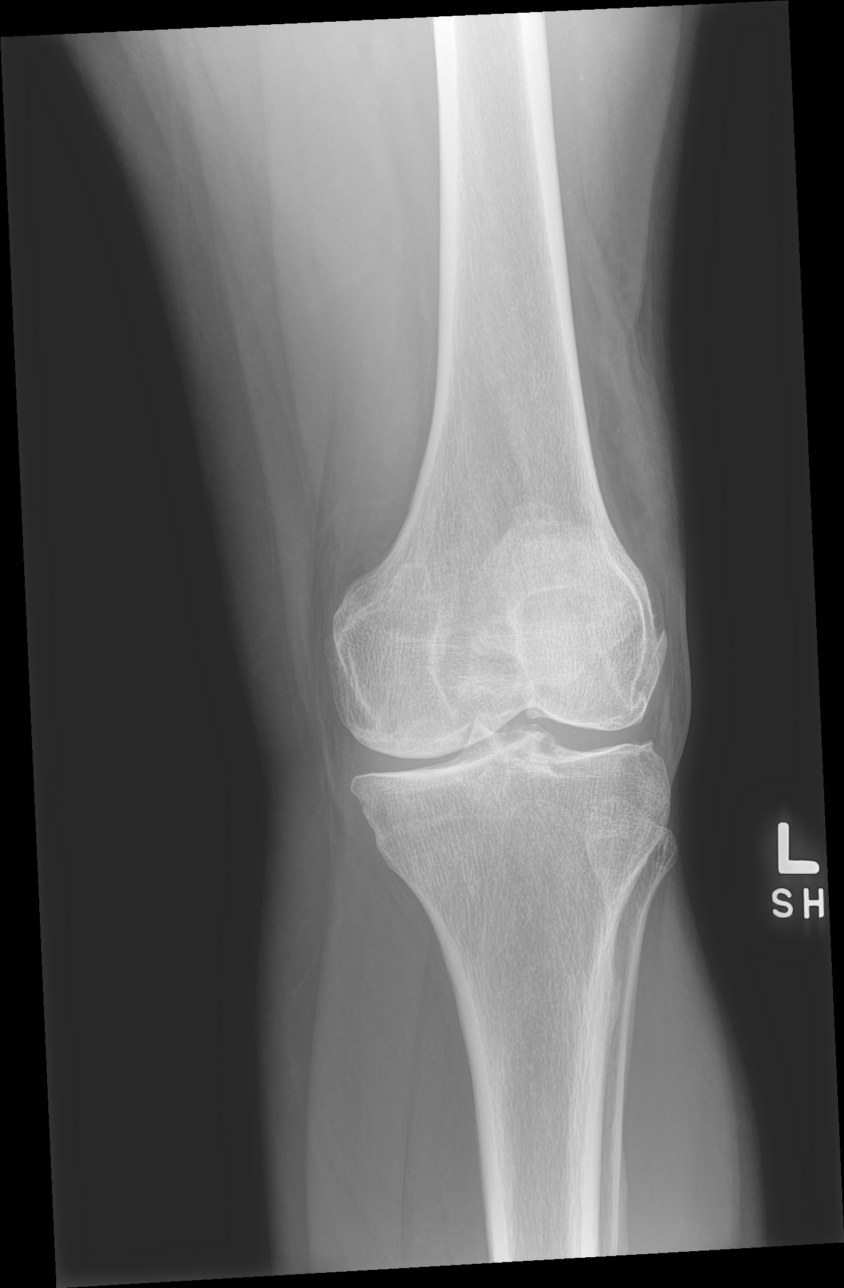

[knee lat]
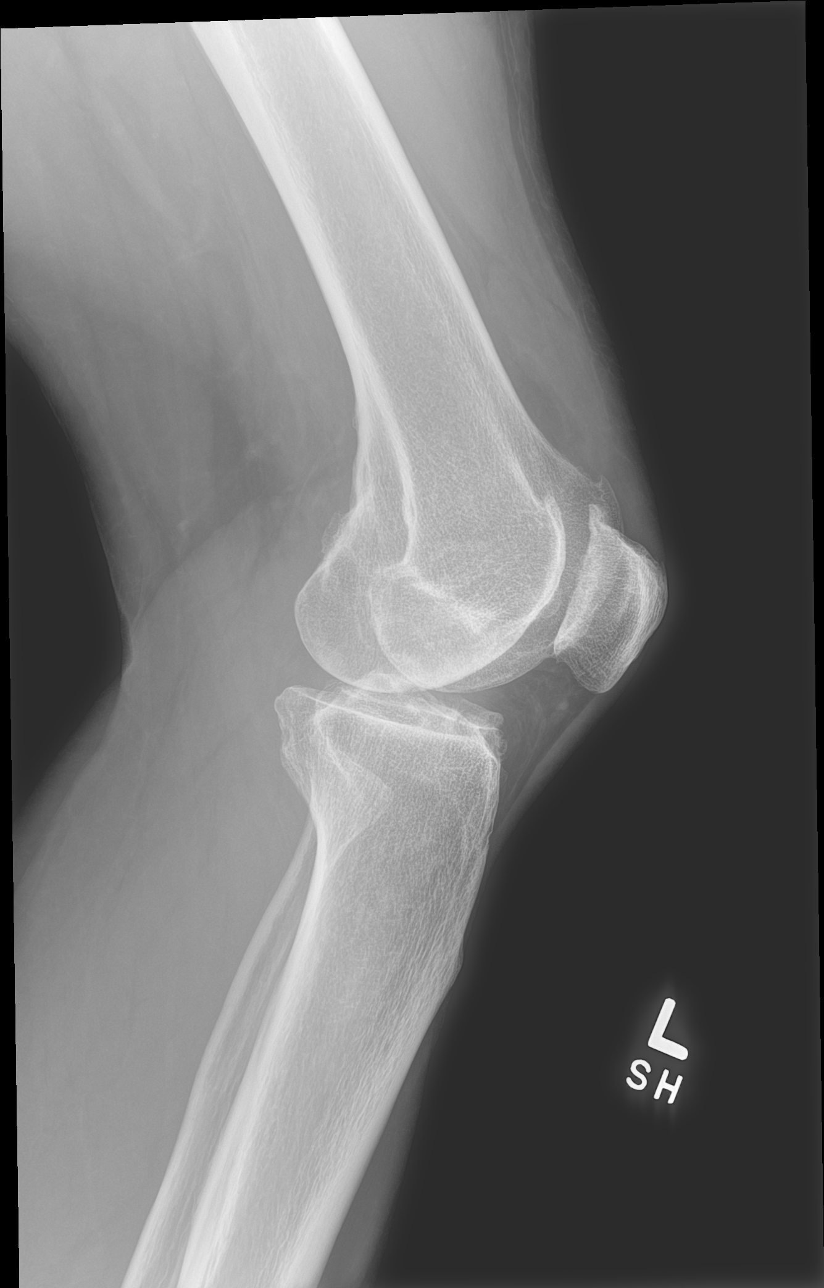

[4 of 4 positions shown; findings below may reference images not displayed]

FINDINGS: No fracture or malalignment. Mild to moderate tricompartment
arthritis, most advanced involving the medial and patellofemoral
compartments. Small knee effusion.
IMPRESSION: Tricompartment arthritis with knee effusion.

## 2022-08-03 IMAGING — US US EXTREM LOW VENOUS*L*
1 series · 14 of 24 positions shown · non-contrast
Comparison: None.

CLINICAL DATA: Left knee pain

EXAM:
Left LOWER EXTREMITY VENOUS DOPPLER ULTRASOUND
TECHNIQUE: Gray-scale sonography with compression, as well as color and duplex
ultrasound, were performed to evaluate the deep venous system(s)
from the level of the common femoral vein through the popliteal and
proximal calf veins.

[Series 1: us venous img lower uni left (dvt) · portal-venous · 14 of 52 slices shown]
[im 1/52]
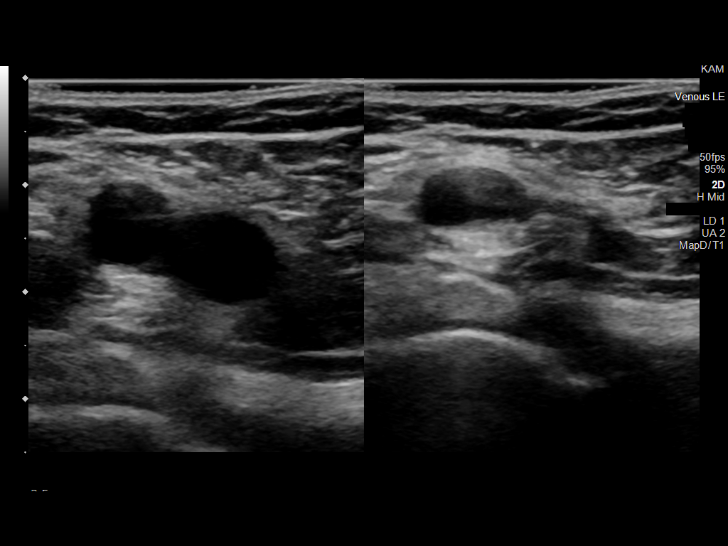
[im 5/52]
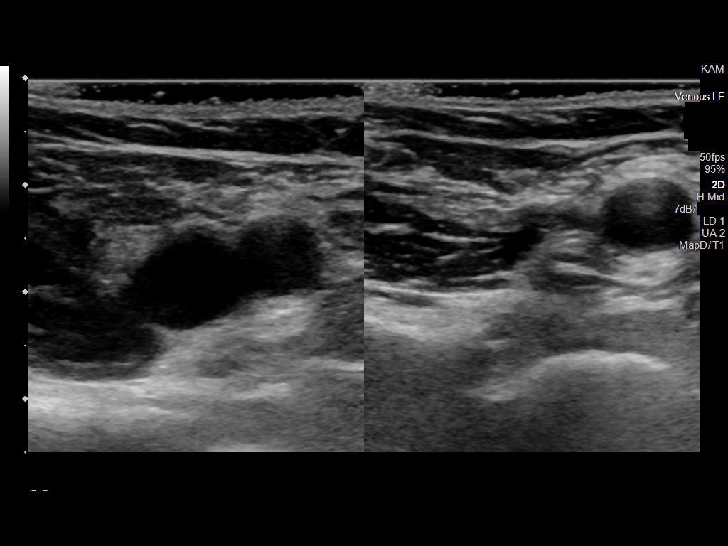
[im 9/52]
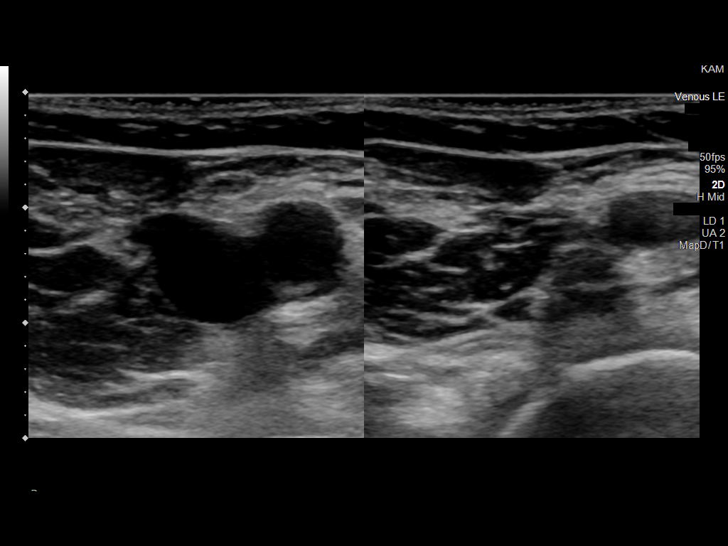
[im 14/52]
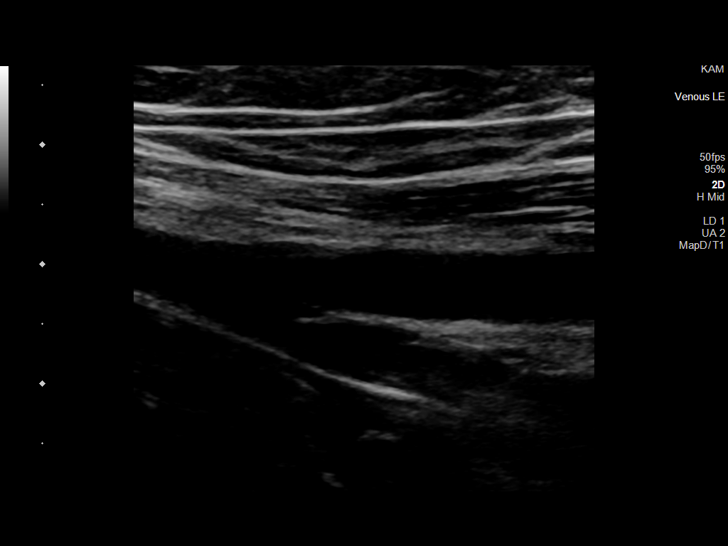
[im 16/52]
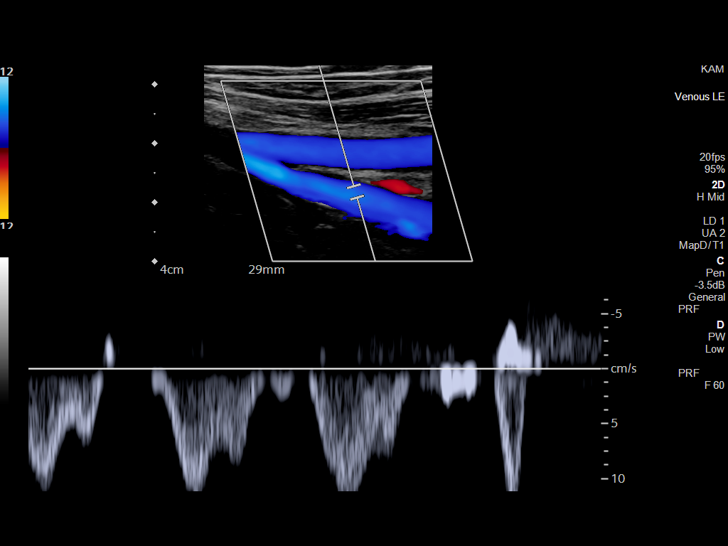
[im 20/52]
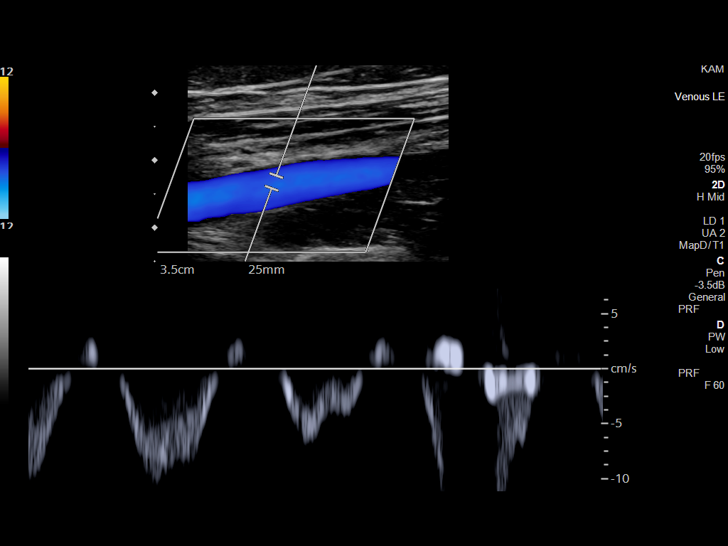
[im 25/52]
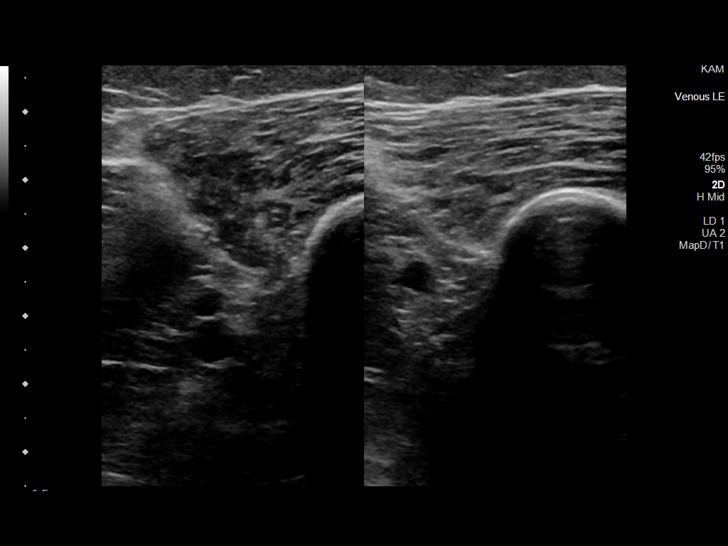
[im 27/52]
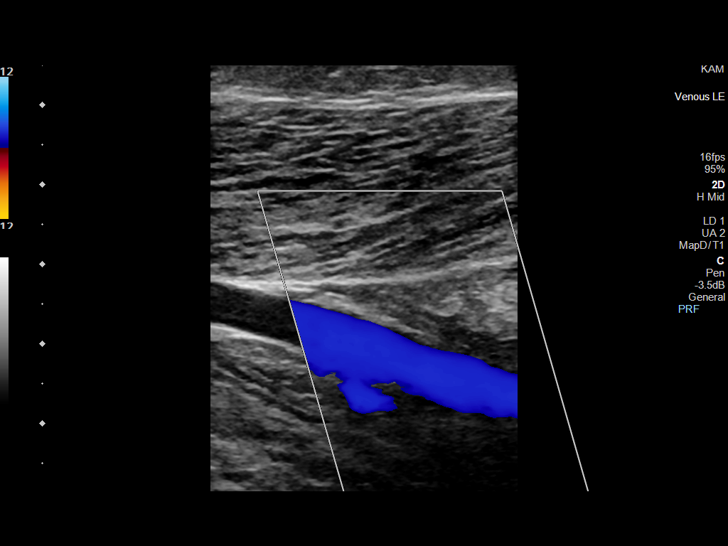
[im 32/52]
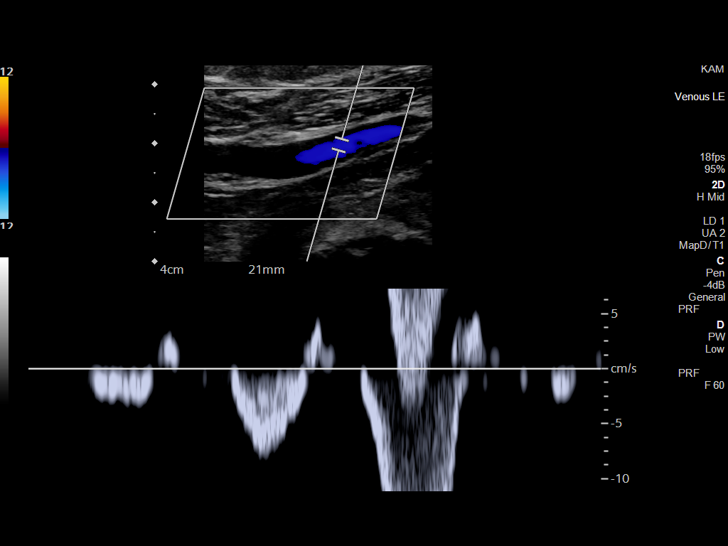
[im 36/52]
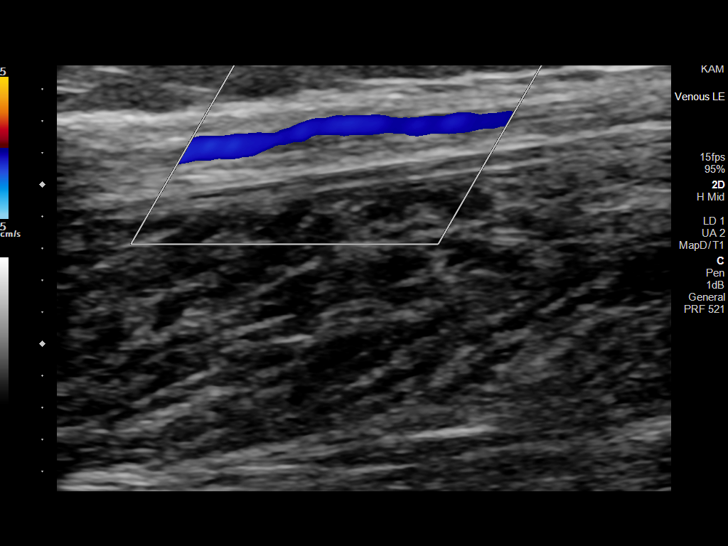
[im 40/52]
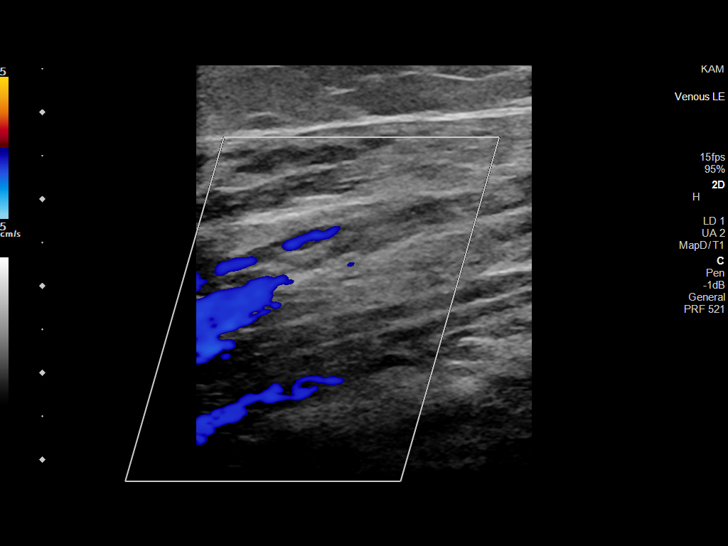
[im 43/52]
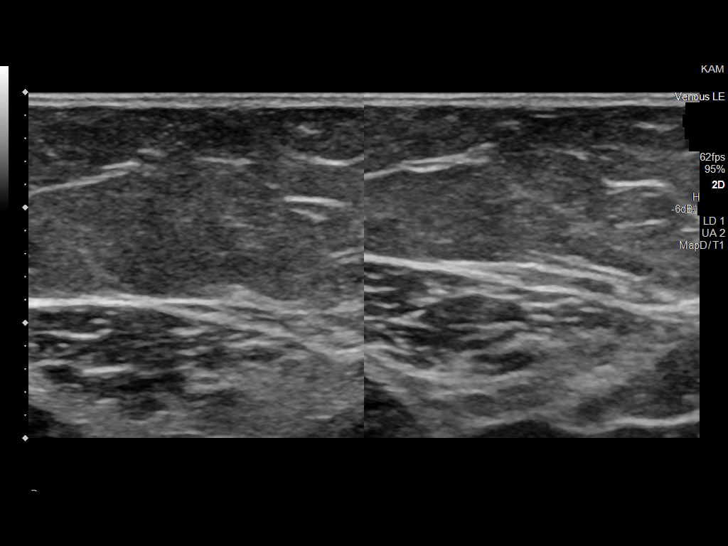
[im 47/52]
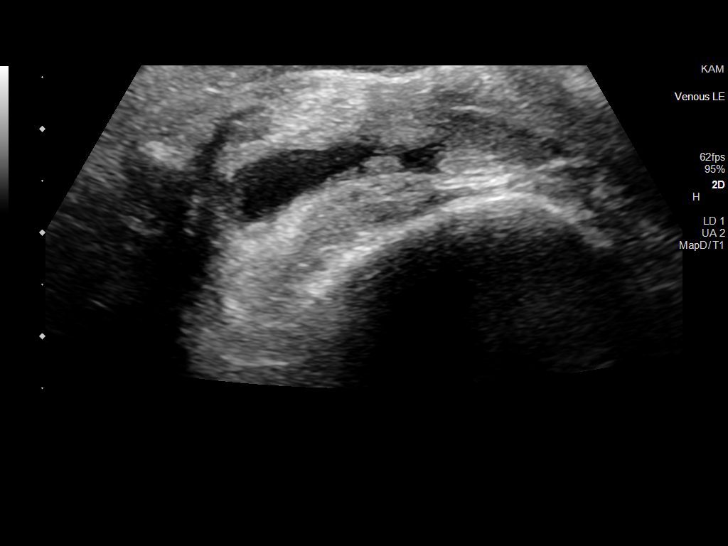
[im 52/52]
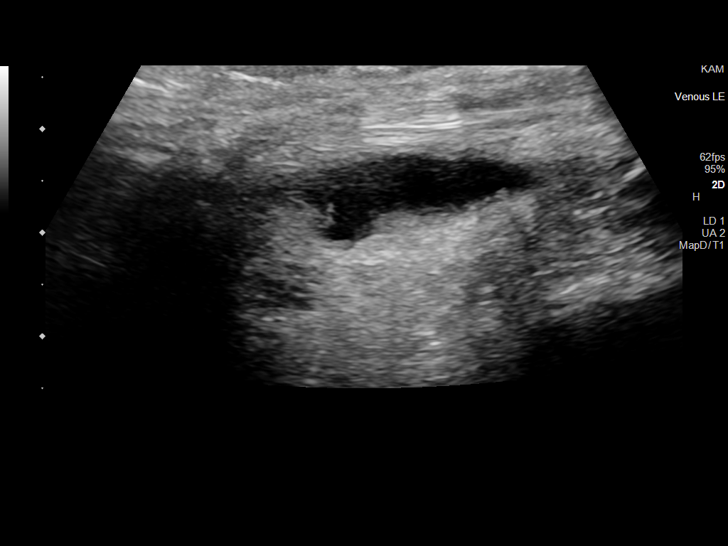

[14 of 24 positions shown; findings below may reference images not displayed]

FINDINGS: VENOUS

Normal compressibility of the common femoral, superficial femoral,
and popliteal veins, as well as the visualized calf veins.
Visualized portions of profunda femoral vein and great saphenous
vein unremarkable. No filling defects to suggest DVT on grayscale or
color Doppler imaging. Doppler waveforms show normal direction of
venous flow, normal respiratory plasticity and response to
augmentation.

Limited views of the contralateral common femoral vein are
unremarkable.

OTHER

Small fluid collection at the anterolateral knee measuring 4.6 x
x 2 cm with scattered internal echoes.

Limitations: none
IMPRESSION: Negative for acute DVT in the left lower extremity. Fluid collection
in the left anterolateral knee.

## 2022-08-31 DIAGNOSIS — I10 Essential (primary) hypertension: Secondary | ICD-10-CM | POA: Diagnosis not present

## 2022-09-07 DIAGNOSIS — M170 Bilateral primary osteoarthritis of knee: Secondary | ICD-10-CM | POA: Diagnosis not present

## 2023-01-24 DIAGNOSIS — M25562 Pain in left knee: Secondary | ICD-10-CM | POA: Diagnosis not present

## 2023-02-15 DIAGNOSIS — Z13220 Encounter for screening for lipoid disorders: Secondary | ICD-10-CM | POA: Diagnosis not present

## 2023-02-15 DIAGNOSIS — I10 Essential (primary) hypertension: Secondary | ICD-10-CM | POA: Diagnosis not present

## 2023-02-15 DIAGNOSIS — M1712 Unilateral primary osteoarthritis, left knee: Secondary | ICD-10-CM | POA: Diagnosis not present

## 2023-02-15 DIAGNOSIS — Z0000 Encounter for general adult medical examination without abnormal findings: Secondary | ICD-10-CM | POA: Diagnosis not present

## 2023-03-20 DIAGNOSIS — M170 Bilateral primary osteoarthritis of knee: Secondary | ICD-10-CM | POA: Diagnosis not present

## 2023-03-20 DIAGNOSIS — M25561 Pain in right knee: Secondary | ICD-10-CM | POA: Diagnosis not present

## 2023-03-20 DIAGNOSIS — M25562 Pain in left knee: Secondary | ICD-10-CM | POA: Diagnosis not present

## 2023-06-09 DIAGNOSIS — M1712 Unilateral primary osteoarthritis, left knee: Secondary | ICD-10-CM | POA: Diagnosis not present

## 2023-06-09 DIAGNOSIS — M25562 Pain in left knee: Secondary | ICD-10-CM | POA: Diagnosis not present

## 2023-06-23 DIAGNOSIS — Z79899 Other long term (current) drug therapy: Secondary | ICD-10-CM | POA: Diagnosis not present

## 2023-07-10 DIAGNOSIS — M1712 Unilateral primary osteoarthritis, left knee: Secondary | ICD-10-CM | POA: Diagnosis not present

## 2023-10-05 DIAGNOSIS — M1711 Unilateral primary osteoarthritis, right knee: Secondary | ICD-10-CM | POA: Diagnosis not present

## 2024-02-15 ENCOUNTER — Encounter: Payer: Self-pay | Admitting: Nurse Practitioner

## 2024-02-15 ENCOUNTER — Ambulatory Visit: Payer: Self-pay | Admitting: Nurse Practitioner

## 2024-02-15 VITALS — BP 128/64 | HR 96 | Temp 99.0°F | Ht 61.0 in | Wt 122.6 lb

## 2024-02-15 DIAGNOSIS — G8929 Other chronic pain: Secondary | ICD-10-CM

## 2024-02-15 DIAGNOSIS — Z96652 Presence of left artificial knee joint: Secondary | ICD-10-CM

## 2024-02-15 DIAGNOSIS — Z13228 Encounter for screening for other metabolic disorders: Secondary | ICD-10-CM

## 2024-02-15 DIAGNOSIS — M25561 Pain in right knee: Secondary | ICD-10-CM

## 2024-02-15 DIAGNOSIS — N3944 Nocturnal enuresis: Secondary | ICD-10-CM

## 2024-02-15 DIAGNOSIS — Z7689 Persons encountering health services in other specified circumstances: Secondary | ICD-10-CM

## 2024-02-15 DIAGNOSIS — I10 Essential (primary) hypertension: Secondary | ICD-10-CM

## 2024-02-15 DIAGNOSIS — R232 Flushing: Secondary | ICD-10-CM

## 2024-02-15 DIAGNOSIS — Z139 Encounter for screening, unspecified: Secondary | ICD-10-CM

## 2024-02-15 DIAGNOSIS — Z2821 Immunization not carried out because of patient refusal: Secondary | ICD-10-CM

## 2024-02-15 DIAGNOSIS — Z1159 Encounter for screening for other viral diseases: Secondary | ICD-10-CM | POA: Diagnosis not present

## 2024-02-15 LAB — POCT URINALYSIS DIP (CLINITEK)
Bilirubin, UA: NEGATIVE
Blood, UA: NEGATIVE
Glucose, UA: NEGATIVE mg/dL
Ketones, POC UA: NEGATIVE mg/dL
Leukocytes, UA: NEGATIVE
Nitrite, UA: NEGATIVE
POC PROTEIN,UA: NEGATIVE
Spec Grav, UA: 1.02 (ref 1.010–1.025)
Urobilinogen, UA: 0.2 U/dL
pH, UA: 7.5 (ref 5.0–8.0)

## 2024-02-15 NOTE — Progress Notes (Signed)
 Evelyn Moyer, CMA,acting as a neurosurgeon for Gaines Ada, FNP.,have documented all relevant documentation on the behalf of Gaines Ada, FNP,as directed by  Gaines Ada, FNP while in the presence of Gaines Ada, FNP.  Subjective:  Patient ID: Evelyn Moyer , female    DOB: 1965-07-08 , 58 y.o.   MRN: [id number]  Chief Complaint  Patient presents with   Establish Care    Patient presents today to establish care, Patient reports compliance with medication. Patient denies any chest pain, SOB, or headaches.    Menopause    Patient reports she would like to discuss menopause, she reports she has a lot of hot flashes.    Hypertension    Patient reports she is taking amLODipine 2.5mg  for her BP. She hasn't taking it in about 2 weeks. Patient reports she does check it at home she states it is sometimes high. She reports it could be due to stress.     HPI Discussed the use of AI scribe software for clinical note transcription with the patient, who gave verbal consent to proceed.  History of Present Illness Evelyn Moyer is a 58 year old female with hypertension who presents for a follow-up visit.  She has been managing her hypertension with amlodipine since 2022 or 2023. Her blood pressure readings have ranged from 109/54 to 144/76, with a recent reading of 131/73. She last took her medication on February 06, 2024, and has been monitoring her blood pressure daily since then.  She has a history of knee replacement surgery in March of the previous year and is planning another knee surgery soon, though no date is set.  She experiences menopausal symptoms, including hot flashes that began at age 65 and have been improving. These symptoms have disrupted her sleep, but it is getting better. She also reports occasional nocturnal enuresis, occurring about once a month for the past three to four years, often associated with dreams of being in the bathroom. No snoring and she feels rested after a good night's  sleep.  Her family history includes high blood pressure and diabetes on her mother's side, with her mother having both conditions and her father having high blood pressure. She is concerned about developing diabetes but currently denies symptoms such as increased thirst, hunger, or urinary frequency.  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 She drinks wine on weekends and is mindful of her diet to prevent diabetes.   She was going to Dr. Mercer Repress at [facility]. She has not had a physical this year. 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 Married. She has 3 children - 11 daughter, 27 son and 34 daughter.   Father - hypertension PMH - hypertension in 2022 - amlodipine 2.5 mg daily. Ran out October 28th.      Past Medical History:  Diagnosis Date   Arthritis    Knees   Hypertension    Panic attacks      Family History  Problem Relation Age of Onset   Hypertension Mother    Diabetes Mother    Arthritis Mother    Cancer Mother    Depression Mother    Hypertension Father      Current Outpatient Medications:    APPLE CIDER VINEGAR PO, Take 2 tablets by mouth daily., Disp: , Rfl:    glucosamine-chondroitin 500-400 MG tablet, Take 2 tablets  by mouth daily., Disp: , Rfl:    Multiple Vitamin (MULTIVITAMIN WITH MINERALS) TABS tablet, Take 1 tablet by mouth daily., Disp: , Rfl:    Multiple Vitamins-Minerals (HAIR SKIN AND NAILS FORMULA PO), Take 2 tablets by mouth daily., Disp: , Rfl:    Probiotic Product (PROBIOTIC DAILY PO), Take 1 capsule by mouth daily. , Disp: , Rfl:    valACYclovir (VALTREX) 500 MG tablet, Take 500 mg by mouth daily as needed (outbreak). , Disp: , Rfl:    amLODipine (NORVASC) 2.5 MG tablet, Take 2.5 mg by mouth daily. (Patient not taking: Reported on 02/15/2024), Disp: , Rfl:    BLACK ELDERBERRY PO, Take 1 tablet by mouth daily.,  Disp: , Rfl:    TURMERIC PO, Take 1 capsule by mouth daily. (Patient not taking: Reported on 02/15/2024), Disp: , Rfl:  No current facility-administered medications for this visit.  Facility-Administered Medications Ordered in Other Visits:    sodium chloride  irrigation 0.9 %, , , PRN, Margrette Taft BRAVO, MD, 6,000 mL at 12/15/20 1226   No Known Allergies   Review of Systems  Constitutional: Negative.   Respiratory: Negative.    Cardiovascular: Negative.   Genitourinary:  Positive for enuresis. Negative for pelvic pain.  Musculoskeletal:  Positive for arthralgias (right knee pain).     Today's Vitals   02/15/24 1030  BP: 128/64  Pulse: 96  Temp: 99 F (37.2 C)  TempSrc: Oral  Weight: 122 lb 9.6 oz (55.6 kg)  Height: 5' 1 (1.549 m)  PainSc: 0-No pain   Body mass index is 23.17 kg/m.  Wt Readings from Last 3 Encounters:  02/15/24 122 lb 9.6 oz (55.6 kg)  04/01/21 117 lb (53.1 kg)  12/15/20 117 lb (53.1 kg)     Objective:  Physical Exam Vitals and nursing note reviewed.  Cardiovascular:     Rate and Rhythm: Normal rate and regular rhythm.     Pulses: Normal pulses.     Heart sounds: Normal heart sounds. No murmur heard. Pulmonary:     Effort: Pulmonary effort is normal. No respiratory distress.     Breath sounds: Normal breath sounds. No wheezing.  Skin:    General: Skin is warm and dry.     Capillary Refill: Capillary refill takes less than 2 seconds.  Neurological:     General: No focal deficit present.     Mental Status: She is oriented to person, place, and time.     Cranial Nerves: No cranial nerve deficit.     Motor: No weakness.      Assessment And Plan:  Establishing care with new doctor, encounter for  Nocturnal enuresis Assessment & Plan: Intermittent enuresis likely due to pelvic floor weakness. Low likelihood of sleep apnea. - Refer to pelvic floor rehabilitation. - Advise reducing liquid intake two hours before bedtime. - Order urine  microalbumin test.  Orders: -     Ambulatory referral to Physical Therapy  Hot flashes Assessment & Plan: Hot flashes since age 50, possibly worsened by alcohol. No current desire for medication. - Advise monitoring alcohol intake, particularly wine, to assess impact on hot flashes.   Primary hypertension Assessment & Plan: Blood pressure fluctuates post-amlodipine discontinuation. Menopause may contribute to elevation. Continued amlodipine due to insufficient assessment time without medication. - Continue amlodipine. - Maintain blood pressure log. - Schedule EKG and baseline labs for preoperative clearance. - Check kidney function.  Orders: -     BMP8+eGFR -     Microalbumin / creatinine urine ratio -  POCT URINALYSIS DIP (CLINITEK)  Chronic pain of right knee Assessment & Plan: Right knee pain with future arthroplasty planned. Previous left knee arthroplasty. - Advise scheduling right knee arthroplasty consultation in December.  Orders: -     VITAMIN D 25 Hydroxy (Vit-D Deficiency, Fractures)  Influenza vaccination declined  Tetanus, diphtheria, and acellular pertussis (Tdap) vaccination declined  History of left knee replacement  Encounter for screening -     Hepatitis B surface antibody,qualitative  Encounter for hepatitis C screening test for low risk patient -     Hepatitis C antibody  Encounter for screening for metabolic disorder -     Hemoglobin A1c    Return in about 3 months (around 05/17/2024) for phy when able. .  Patient was given opportunity to ask questions. Patient verbalized understanding of the plan and was able to repeat key elements of the plan. All questions were answered to their satisfaction.     Evelyn Gaines Ada, FNP, have reviewed all documentation for this visit. The documentation on 02/15/24 for the exam, diagnosis, procedures, and orders are all accurate and complete.  IF YOU HAVE BEEN REFERRED TO A SPECIALIST, IT MAY TAKE 1-2 WEEKS  TO SCHEDULE/PROCESS THE REFERRAL. IF YOU HAVE NOT HEARD FROM US /SPECIALIST IN TWO WEEKS, PLEASE GIVE US  A CALL AT [phone].

## 2024-02-16 LAB — HEMOGLOBIN A1C
Est. average glucose Bld gHb Est-mCnc: 105 mg/dL
Hgb A1c MFr Bld: 5.3 % (ref 4.8–5.6)

## 2024-02-16 LAB — BMP8+EGFR
BUN/Creatinine Ratio: 16 (ref 9–23)
BUN: 13 mg/dL (ref 6–24)
CO2: 26 mmol/L (ref 20–29)
Calcium: 9.9 mg/dL (ref 8.7–10.2)
Chloride: 102 mmol/L (ref 96–106)
Creatinine, Ser: 0.79 mg/dL (ref 0.57–1.00)
Glucose: 87 mg/dL (ref 70–99)
Potassium: 4.6 mmol/L (ref 3.5–5.2)
Sodium: 142 mmol/L (ref 134–144)
eGFR: 88 mL/min/1.73 (ref >59)

## 2024-02-16 LAB — MICROALBUMIN / CREATININE URINE RATIO
Creatinine, Urine: 63.4 mg/dL
Microalb/Creat Ratio: 9 mg/g{creat} (ref 0–29)
Microalbumin, Urine: 5.9 ug/mL

## 2024-02-16 LAB — HEPATITIS B SURFACE ANTIBODY,QUALITATIVE: Hep B Surface Ab, Qual: NONREACTIVE

## 2024-02-16 LAB — VITAMIN D 25 HYDROXY (VIT D DEFICIENCY, FRACTURES): Vit D, 25-Hydroxy: 40.4 ng/mL (ref 30.0–100.0)

## 2024-02-16 LAB — HEPATITIS C ANTIBODY: Hep C Virus Ab: NONREACTIVE

## 2024-02-25 ENCOUNTER — Ambulatory Visit: Payer: Self-pay | Admitting: Nurse Practitioner

## 2024-02-25 NOTE — Assessment & Plan Note (Signed)
 Intermittent enuresis likely due to pelvic floor weakness. Low likelihood of sleep apnea. - Refer to pelvic floor rehabilitation. - Advise reducing liquid intake two hours before bedtime. - Order urine microalbumin test.

## 2024-02-25 NOTE — Assessment & Plan Note (Signed)
 Right knee pain with future arthroplasty planned. Previous left knee arthroplasty. - Advise scheduling right knee arthroplasty consultation in December.

## 2024-02-25 NOTE — Assessment & Plan Note (Signed)
 Hot flashes since age 58, possibly worsened by alcohol. No current desire for medication. - Advise monitoring alcohol intake, particularly wine, to assess impact on hot flashes.

## 2024-02-25 NOTE — Assessment & Plan Note (Signed)
 Blood pressure fluctuates post-amlodipine discontinuation. Menopause may contribute to elevation. Continued amlodipine due to insufficient assessment time without medication. - Continue amlodipine. - Maintain blood pressure log. - Schedule EKG and baseline labs for preoperative clearance. - Check kidney function.

## 2024-03-01 ENCOUNTER — Other Ambulatory Visit: Payer: Self-pay

## 2024-03-01 ENCOUNTER — Ambulatory Visit (HOSPITAL_COMMUNITY): Attending: Nurse Practitioner | Admitting: Physical Therapy

## 2024-03-01 DIAGNOSIS — M6281 Muscle weakness (generalized): Secondary | ICD-10-CM | POA: Diagnosis not present

## 2024-03-01 NOTE — Therapy (Signed)
 OUTPATIENT PHYSICAL THERAPY FEMALE PELVIC EVALUATION   Patient Name: Evelyn Moyer MRN: [id number] DOB:[birth year], 58 y.o., female Today's Date: 03/01/2024  END OF SESSION:  PT End of Session - 03/01/24 1654     Number of Visits 4    Date for Recertification  04/10/24    Authorization Type [employer]    Authorization Time Period placed    Progress Note Due on Visit 4    PT Start Time 1610    PT Stop Time 1646    PT Time Calculation (min) 36 min    Activity Tolerance Patient tolerated treatment well    Behavior During Therapy WFL for tasks assessed/performed          Past Medical History:  Diagnosis Date   Arthritis    Knees   Hypertension    Panic attacks    Past Surgical History:  Procedure Laterality Date   APPENDECTOMY     COLONOSCOPY N/A 03/30/2018   Procedure: COLONOSCOPY;  Surgeon: Shaaron Lamar HERO, MD;  Location: [facility];  Service: Endoscopy;  Laterality: N/A;  12:00   JOINT REPLACEMENT  06/2022   Left knee replacement   KNEE ARTHROSCOPY WITH MEDIAL MENISECTOMY Left 12/15/2020   Procedure: KNEE ARTHROSCOPY WITH MEDIAL MENISECTOMY;  Surgeon: Margrette Taft BRAVO, MD;  Location: [facility];  Service: Orthopedics;  Laterality: Left;   TUBAL LIGATION     Patient Active Problem List   Diagnosis Date Noted   Nocturnal enuresis 02/15/2024   Hot flashes 02/15/2024   Primary hypertension 02/15/2024   Chronic pain of right knee 02/15/2024   Vitamin D  deficiency 02/17/2021   Derangement of posterior horn of medial meniscus of left knee    S/P left knee arthroscopy 12/15/20 12/22/2020    PCP: Gaines Ada  REFERRING PROVIDER: Gaines Ada   REFERRING DIAG: N39.44 (ICD-10-CM) - Nocturnal enuresis  THERAPY DIAG:  Muscle weakness (generalized) - Plan: PT plan of care cert/re-cert  Rationale for Evaluation and Treatment: Rehabilitation  ONSET DATE: chronic   SUBJECTIVE:                                                                                                                                                                                            SUBJECTIVE STATEMENT: Evelyn Moyer states that she has been urinating in the bed about twice a month.  She normally is dreaming that she is going to the bathroom and then she realizes that she is going to the bathroom but it is to late.    She states that she voids prior to going to bed.  She normally takes water  with her to bed.  PERTINENT HISTORY:  Are you having pain? No  PRECAUTIONS: None  RED FLAGS: None   WEIGHT BEARING RESTRICTIONS: No  FALLS:  Has patient fallen in last 6 months? No   ACTIVITY LEVEL : moderate  PLOF: Independent  PATIENT GOALS: not to urinate in bed.    BOWEL MOVEMENT: Pain with bowel movement: No URINATION: Pain with urination: No Fully empty bladder: No                                        Post-void dribble: No Stream: Strong Urgency: Yes  Frequency:during the day typically every hour and a half to two.                                                          Nocturia: Yes:     Leakage: none Pads/briefs: No   OBJECTIVE:  Note: Objective measures were completed at Evaluation unless otherwise noted.  DIAGNOSTIC FINDINGS: none   COGNITION: Overall cognitive status: Within functional limits for tasks assessed       POSTURE: No Significant postural limitations    PALPATION: good activation of lower abdominal mm                TONE: Good   TODAY'S TREATMENT:                                                                                                                              DATE: 03/01/24:  EVAL  Long hold kegal 35  x 5 reps Quick flick 2 hold 4 relax x 10 Sitting lower abdominal hold 5 hold x 10     PATIENT EDUCATION:  Education details: HEP Person educated: Patient Education method: Explanation and Handouts Education comprehension: verbalized understanding  HOME EXERCISE PROGRAM: 03/01/24 Long hold kegal 35  x 5  reps Quick flick 2 hold 4 relax x 10 Sitting lower abdominal hold 5 hold x 10  Increase time between voiding during the day.  ASSESSMENT:  CLINICAL IMPRESSION: Patient is a 58 y.o. female who was seen today for physical therapy evaluation and treatment for N39.44 (ICD-10-CM) - Nocturnal enuresis.   Pt has no problem with incontinence during the day.  Pt voids at night approximately 2x a month.  We will see the pt for pelvic floor strengthening to see if this assist in decreasing her voiding at night.   OBJECTIVE IMPAIRMENTS: decreased strength.   ACTIVITY LIMITATIONS: continence  REHAB POTENTIAL: Fair  CLINICAL DECISION MAKING: Stable/uncomplicated  EVALUATION COMPLEXITY: Low   GOALS: Goals reviewed with patient? Yes  SHORT TERM GOALS: Target date: 03/15/24  Pt to have voided in bed only one time in the past two  weeks  Baseline: Goal status: INITIAL  2.  Pt to be I in HEP Baseline:  Goal status: INITIAL  LONG TERM GOALS: Target date: 04/10/24 due to closings for holidays   Pt to have had no voiding in bed over the past three weeks  Baseline:  Goal status: INITIAL  2.  PT to be I in an advanced HEP  Baseline:  Goal status: INITIAL   PLAN:  PT FREQUENCY: 1x/week  PT DURATION: 4 weeks  PLANNED INTERVENTIONS: 97110-Therapeutic exercises, 02887- Neuromuscular re-education, 97535- Self Care, and Patient/Family education  PLAN FOR NEXT SESSION: begin mat lower abdominal strengthening exercises.   Managed Medicaid Authorization Request Treatment Start Date: 2024/03/09  Visit Dx Codes: N39.44 (ICD-10-CM) - Nocturnal enuresis  Functional Tool Score: 2 bed wetting/month  For all possible CPT codes, reference the Planned Interventions line above.     Check all conditions that are expected to impact treatment: {Conditions expected to impact treatment:None of these apply   If treatment provided at initial evaluation, no treatment charged due to lack of  authorization.      Montie Metro, PT CLT [phone]  2024-03-09, 4:55 PM

## 2024-03-12 ENCOUNTER — Ambulatory Visit (HOSPITAL_COMMUNITY): Admitting: Physical Therapy

## 2024-03-12 ENCOUNTER — Telehealth (HOSPITAL_COMMUNITY): Payer: Self-pay | Admitting: Physical Therapy

## 2024-03-12 NOTE — Telephone Encounter (Signed)
 First No Show:  Called pt, there was no answer.  Left message of when her next appointment would be.   Montie Metro, PT CLT [phone]

## 2024-03-15 DIAGNOSIS — M1711 Unilateral primary osteoarthritis, right knee: Secondary | ICD-10-CM | POA: Diagnosis not present

## 2024-03-20 ENCOUNTER — Ambulatory Visit (HOSPITAL_COMMUNITY): Admitting: Physical Therapy

## 2024-03-26 ENCOUNTER — Telehealth (HOSPITAL_COMMUNITY): Payer: Self-pay | Admitting: Physical Therapy

## 2024-03-26 ENCOUNTER — Ambulatory Visit (HOSPITAL_COMMUNITY): Admitting: Physical Therapy

## 2024-03-26 NOTE — Telephone Encounter (Signed)
 Second no show:  Therapist left a message that at this time she has no other appointments and will need to call if she desires to be seen.  Montie Metro, PT CLT [phone]

## 2024-04-24 ENCOUNTER — Other Ambulatory Visit: Payer: Self-pay | Admitting: Nurse Practitioner

## 2024-04-24 DIAGNOSIS — I10 Essential (primary) hypertension: Secondary | ICD-10-CM

## 2024-04-29 ENCOUNTER — Ambulatory Visit
Admission: RE | Admit: 2024-04-29 | Discharge: 2024-04-29 | Disposition: A | Discharge status: Home / Self Care | Source: Home / Self Care

## 2024-04-29 VITALS — BP 156/71 | HR 71 | Temp 99.2°F | Resp 16

## 2024-04-29 DIAGNOSIS — H65191 Other acute nonsuppurative otitis media, right ear: Secondary | ICD-10-CM | POA: Diagnosis not present

## 2024-04-29 DIAGNOSIS — H60391 Other infective otitis externa, right ear: Secondary | ICD-10-CM | POA: Diagnosis not present

## 2024-04-29 MED ORDER — AZELASTINE HCL 0.1 % NA SOLN: 1.0000 | Freq: Two times a day (BID) | NASAL | 0 refills | Status: AC

## 2024-04-29 MED ORDER — CIPROFLOXACIN-DEXAMETHASONE 0.3-0.1 % OT SUSP: 4.0000 [drp] | Freq: Two times a day (BID) | OTIC | 0 refills | Status: AC

## 2024-04-29 NOTE — ED Provider Notes (Signed)
 " [facility]    CSN: [id number] Arrival date & time: 04/29/24  1455      History   Chief Complaint Chief Complaint  Patient presents with   Otalgia    HPI Evelyn Moyer is a 59 y.o. female.   Patient presenting today with 1 day history of right ear pain, cracking skin in and around the ear, drainage, decreased hearing.  Denies congestion, headache, cough, fever.  So far not trying anything over-the-counter for symptoms.    Past Medical History:  Diagnosis Date   Arthritis    Knees   Hypertension    Panic attacks     Patient Active Problem List   Diagnosis Date Noted   Nocturnal enuresis 02/15/2024   Hot flashes 02/15/2024   Primary hypertension 02/15/2024   Chronic pain of right knee 02/15/2024   Vitamin D  deficiency 02/17/2021   Derangement of posterior horn of medial meniscus of left knee    S/P left knee arthroscopy 12/15/20 12/22/2020    Past Surgical History:  Procedure Laterality Date   APPENDECTOMY     COLONOSCOPY N/A 03/30/2018   Procedure: COLONOSCOPY;  Surgeon: Shaaron Lamar HERO, MD;  Location: [facility];  Service: Endoscopy;  Laterality: N/A;  12:00   JOINT REPLACEMENT  06/2022   Left knee replacement   KNEE ARTHROSCOPY WITH MEDIAL MENISECTOMY Left 12/15/2020   Procedure: KNEE ARTHROSCOPY WITH MEDIAL MENISECTOMY;  Surgeon: Margrette Taft BRAVO, MD;  Location: [facility];  Service: Orthopedics;  Laterality: Left;   TUBAL LIGATION      OB History   No obstetric history on file.      Home Medications    Prior to Admission medications  Medication Sig Start Date End Date Taking? Authorizing Provider  amLODipine (NORVASC) 2.5 MG tablet TAKE ONE TABLET BY MOUTH EVERY DAY 04/24/24  Yes Georgina Speaks, FNP  APPLE CIDER VINEGAR PO Take 2 tablets by mouth daily.   Yes [provider]  azelastine  (ASTELIN ) 0.1 % nasal spray Place 1 spray into both nostrils 2 (two) times daily. Use in each nostril as directed 04/29/24  Yes Stuart Vernell Norris, PA-C  ciprofloxacin -dexamethasone  (CIPRODEX ) OTIC suspension Place 4 drops into the right ear 2 (two) times daily. 04/29/24  Yes Stuart Vernell Norris, PA-C  Multiple Vitamin (MULTIVITAMIN WITH MINERALS) TABS tablet Take 1 tablet by mouth daily.   Yes [provider]  Probiotic Product (PROBIOTIC DAILY PO) Take 1 capsule by mouth daily.    Yes [provider]  BLACK ELDERBERRY PO Take 1 tablet by mouth daily.    [provider]  glucosamine-chondroitin 500-400 MG tablet Take 2 tablets by mouth daily. Patient not taking: Reported on 04/29/2024    [provider]  Multiple Vitamins-Minerals (HAIR SKIN AND NAILS FORMULA PO) Take 2 tablets by mouth daily.    [provider]  TURMERIC PO Take 1 capsule by mouth daily. Patient not taking: Reported on 02/15/2024    [provider]  valACYclovir (VALTREX) 500 MG tablet Take 500 mg by mouth daily as needed (outbreak).     [provider]    Family History Family History  Problem Relation Age of Onset   Hypertension Mother    Diabetes Mother    Arthritis Mother    Cancer Mother    Depression Mother    Hypertension Father     Social History Social History[1]   Allergies   Celecoxib   Review of Systems Review of Systems Per HPI  Physical Exam Triage Vital Signs ED Triage Vitals  Encounter Vitals Group     BP 04/29/24 1513 (!) 156/71     Girls Systolic BP Percentile --      Girls Diastolic BP Percentile --      Boys Systolic BP Percentile --      Boys Diastolic BP Percentile --      Pulse Rate 04/29/24 1513 71     Resp 04/29/24 1513 16     Temp 04/29/24 1513 99.2 F (37.3 C)     Temp Source 04/29/24 1513 Oral     SpO2 04/29/24 1513 98 %     Weight --      Height --      Head Circumference --      Peak Flow --      Pain Score 04/29/24 1514 4     Pain Loc --      Pain Education --      Exclude from Growth Chart --    No data found.  Updated Vital  Signs BP (!) 156/71 (BP Location: Right Arm)   Pulse 71   Temp 99.2 F (37.3 C) (Oral)   Resp 16   SpO2 98%   Visual Acuity Right Eye Distance:   Left Eye Distance:   Bilateral Distance:    Right Eye Near:   Left Eye Near:    Bilateral Near:     Physical Exam Vitals and nursing note reviewed.  Constitutional:      Appearance: Normal appearance. She is not ill-appearing.  HENT:     Head: Atraumatic.     Ears:     Comments: Dry cracking skin behind and to the ear canal of the right ear.  Right EAC erythematous, edematous with yellow dried drainage oozing from certain areas.  Right middle ear effusion present    Nose: Nose normal.     Mouth/Throat:     Mouth: Mucous membranes are moist.  Eyes:     Extraocular Movements: Extraocular movements intact.     Conjunctiva/sclera: Conjunctivae normal.  Cardiovascular:     Rate and Rhythm: Normal rate.  Pulmonary:     Effort: Pulmonary effort is normal.  Musculoskeletal:        General: Normal range of motion.     Cervical back: Normal range of motion and neck supple.  Skin:    General: Skin is warm and dry.  Neurological:     Mental Status: She is alert and oriented to person, place, and time.  Psychiatric:        Mood and Affect: Mood normal.        Thought Content: Thought content normal.        Judgment: Judgment normal.    UC Treatments / Results  Labs (all labs ordered are listed, but only abnormal results are displayed) Labs Reviewed - No data to display  EKG   Radiology No results found.  Procedures Procedures (including critical care time)  Medications Ordered in UC Medications - No data to display  Initial Impression / Assessment and Plan / UC Course  I have reviewed the triage vital signs and the nursing notes.  Pertinent labs & imaging results that were available during my care of the patient were reviewed by me and considered in my medical decision making (see chart for details).     Do suspect  her ear symptoms to be multifactorial today.  Will treat with Astelin  nasal spray for middle ear effusion and Ciprodex   drops for otitis externa of the ear canal likely secondary to eczema/dry cracking skin that she has been scratching at.  Supportive home care and return precautions reviewed.  Final Clinical Impressions(s) / UC Diagnoses   Final diagnoses:  Other infective acute otitis externa of right ear  Acute MEE (middle ear effusion), right     Discharge Instructions      I believe your ear pain and muffled hearing to be related to 2 different issues going on.  I have sent in some antibiotic and steroid drops to help with the dry cracking skin that appears to be becoming an ear canal skin infection.  Try to avoid scratching at this area, and you may apply barrier cream such as Aquaphor or Vaseline when you feel the area getting dry, cracking and irritated going forward to hopefully prevent skin infections from this.  You do also have some increased fluid pressure to the middle ear which is very common with season changes, seasonal allergies.  I have prescribed a nasal spray called Astelin  to help with the inner pressure to the ear.  Follow-up for worsening or unresolving symptoms     ED Prescriptions     Medication Sig Dispense Auth. Provider   azelastine  (ASTELIN ) 0.1 % nasal spray Place 1 spray into both nostrils 2 (two) times daily. Use in each nostril as directed 30 mL Stuart Vernell Norris, PA-C   ciprofloxacin -dexamethasone  (CIPRODEX ) OTIC suspension Place 4 drops into the right ear 2 (two) times daily. 7.5 mL Stuart Vernell Norris, [place]      PDMP not reviewed this encounter.    [1]  Social History Tobacco Use   Smoking status: Never   Smokeless tobacco: Never  Vaping Use   Vaping status: Never Used  Substance Use Topics   Alcohol use: Yes    Alcohol/week: 2.0 standard drinks of alcohol    Types: 2 Glasses of wine per week    Comment: ocass   Drug use: Never      Stuart Vernell Norris, PA-C 04/29/24 1600  "

## 2024-04-29 NOTE — ED Triage Notes (Signed)
 Right ear pain, and decreased hearing x 1 day.

## 2024-04-29 NOTE — Discharge Instructions (Addendum)
 I believe your ear pain and muffled hearing to be related to 2 different issues going on.  I have sent in some antibiotic and steroid drops to help with the dry cracking skin that appears to be becoming an ear canal skin infection.  Try to avoid scratching at this area, and you may apply barrier cream such as Aquaphor or Vaseline when you feel the area getting dry, cracking and irritated going forward to hopefully prevent skin infections from this.  You do also have some increased fluid pressure to the middle ear which is very common with season changes, seasonal allergies.  I have prescribed a nasal spray called Astelin  to help with the inner pressure to the ear.  Follow-up for worsening or unresolving symptoms

## 2024-05-13 NOTE — Assessment & Plan Note (Signed)
 SABRA

## 2024-05-15 ENCOUNTER — Ambulatory Visit: Admitting: Nurse Practitioner

## 2024-05-15 ENCOUNTER — Encounter: Payer: Self-pay | Admitting: Nurse Practitioner

## 2024-05-15 VITALS — BP 130/80 | HR 60 | Temp 99.0°F | Ht 61.0 in | Wt 123.2 lb

## 2024-05-15 DIAGNOSIS — Z01818 Encounter for other preprocedural examination: Secondary | ICD-10-CM

## 2024-05-15 DIAGNOSIS — L308 Other specified dermatitis: Secondary | ICD-10-CM

## 2024-05-15 DIAGNOSIS — G8929 Other chronic pain: Secondary | ICD-10-CM

## 2024-05-15 DIAGNOSIS — Z13220 Encounter for screening for lipoid disorders: Secondary | ICD-10-CM

## 2024-05-15 DIAGNOSIS — Z0000 Encounter for general adult medical examination without abnormal findings: Secondary | ICD-10-CM

## 2024-05-15 DIAGNOSIS — E559 Vitamin D deficiency, unspecified: Secondary | ICD-10-CM

## 2024-05-15 DIAGNOSIS — I10 Essential (primary) hypertension: Secondary | ICD-10-CM

## 2024-05-15 DIAGNOSIS — Z79899 Other long term (current) drug therapy: Secondary | ICD-10-CM

## 2024-05-15 LAB — POCT URINALYSIS DIP (CLINITEK)
Bilirubin, UA: NEGATIVE
Blood, UA: NEGATIVE
Glucose, UA: NEGATIVE mg/dL
Ketones, POC UA: NEGATIVE mg/dL
Leukocytes, UA: NEGATIVE
Nitrite, UA: NEGATIVE
POC PROTEIN,UA: NEGATIVE
Spec Grav, UA: <=1.005 — AB
Urobilinogen, UA: 0.2 U/dL
pH, UA: 6

## 2024-05-15 MED ORDER — MOMETASONE FUROATE 0.1 % EX OINT: TOPICAL_OINTMENT | Freq: Every day | CUTANEOUS | 1 refills | Status: AC

## 2024-05-15 NOTE — Assessment & Plan Note (Signed)
 SABRA

## 2024-05-16 ENCOUNTER — Ambulatory Visit: Payer: Self-pay | Admitting: Nurse Practitioner

## 2024-05-16 LAB — CBC WITH DIFFERENTIAL/PLATELET
Basophils Absolute: 0 10*3/uL (ref 0.0–0.2)
Basos: 1 %
EOS (ABSOLUTE): 0.1 10*3/uL (ref 0.0–0.4)
Eos: 1 %
Hematocrit: 38.5 % (ref 34.0–46.6)
Hemoglobin: 12.7 g/dL (ref 11.1–15.9)
Immature Grans (Abs): 0 10*3/uL (ref 0.0–0.1)
Immature Granulocytes: 0 %
Lymphocytes Absolute: 1.6 10*3/uL (ref 0.7–3.1)
Lymphs: 32 %
MCH: 30.6 pg (ref 26.6–33.0)
MCHC: 33 g/dL (ref 31.5–35.7)
MCV: 93 fL (ref 79–97)
Monocytes Absolute: 0.4 10*3/uL (ref 0.1–0.9)
Monocytes: 7 %
Neutrophils Absolute: 3 10*3/uL (ref 1.4–7.0)
Neutrophils: 59 %
Platelets: 252 10*3/uL (ref 150–450)
RBC: 4.15 x10E6/uL (ref 3.77–5.28)
RDW: 12.2 % (ref 11.7–15.4)
WBC: 5.1 10*3/uL (ref 3.4–10.8)

## 2024-05-16 LAB — MICROALBUMIN / CREATININE URINE RATIO
Creatinine, Urine: 30 mg/dL
Microalb/Creat Ratio: <10 mg/g{creat} (ref 0–29)
Microalbumin, Urine: <3 ug/mL

## 2024-05-16 LAB — LIPID PANEL
Chol/HDL Ratio: 3.6 ratio (ref 0.0–4.4)
Cholesterol, Total: 181 mg/dL (ref 100–199)
HDL: 50 mg/dL
LDL Chol Calc (NIH): 104 mg/dL — ABNORMAL HIGH (ref 0–99)
Triglycerides: 152 mg/dL — ABNORMAL HIGH (ref 0–149)
VLDL Cholesterol Cal: 27 mg/dL (ref 5–40)

## 2024-05-16 LAB — CMP14+EGFR
ALT: 14 [IU]/L (ref 0–32)
AST: 21 [IU]/L (ref 0–40)
Albumin: 4.6 g/dL (ref 3.8–4.9)
Alkaline Phosphatase: 74 [IU]/L (ref 49–135)
BUN/Creatinine Ratio: 17 (ref 9–23)
BUN: 13 mg/dL (ref 6–24)
Bilirubin Total: 0.5 mg/dL (ref 0.0–1.2)
CO2: 23 mmol/L (ref 20–29)
Calcium: 9.6 mg/dL (ref 8.7–10.2)
Chloride: 103 mmol/L (ref 96–106)
Creatinine, Ser: 0.76 mg/dL (ref 0.57–1.00)
Globulin, Total: 2.5 g/dL (ref 1.5–4.5)
Glucose: 80 mg/dL (ref 70–99)
Potassium: 4.1 mmol/L (ref 3.5–5.2)
Sodium: 140 mmol/L (ref 134–144)
Total Protein: 7.1 g/dL (ref 6.0–8.5)
eGFR: 92 mL/min/{1.73_m2}

## 2024-05-16 LAB — VITAMIN D 25 HYDROXY (VIT D DEFICIENCY, FRACTURES): Vit D, 25-Hydroxy: 31.7 ng/mL (ref 30.0–100.0)

## 2024-11-12 ENCOUNTER — Ambulatory Visit: Payer: Self-pay | Admitting: Nurse Practitioner

## 2025-05-20 ENCOUNTER — Encounter: Payer: Self-pay | Admitting: Nurse Practitioner
